# Patient Record
Sex: Male | Born: 1955 | Race: White | Hispanic: No | Marital: Married | Smoking: Former smoker
Health system: Southern US, Community
[De-identification: ages and names within clinical notes are randomized; demographics above are authoritative.]

## PROBLEM LIST (undated history)

## (undated) DIAGNOSIS — E785 Hyperlipidemia, unspecified: Secondary | ICD-10-CM

## (undated) DIAGNOSIS — Z87442 Personal history of urinary calculi: Secondary | ICD-10-CM

## (undated) DIAGNOSIS — M1990 Unspecified osteoarthritis, unspecified site: Secondary | ICD-10-CM

## (undated) DIAGNOSIS — K219 Gastro-esophageal reflux disease without esophagitis: Secondary | ICD-10-CM

## (undated) DIAGNOSIS — F988 Other specified behavioral and emotional disorders with onset usually occurring in childhood and adolescence: Secondary | ICD-10-CM

## (undated) DIAGNOSIS — J309 Allergic rhinitis, unspecified: Secondary | ICD-10-CM

## (undated) DIAGNOSIS — I499 Cardiac arrhythmia, unspecified: Secondary | ICD-10-CM

## (undated) DIAGNOSIS — N200 Calculus of kidney: Secondary | ICD-10-CM

## (undated) DIAGNOSIS — I10 Essential (primary) hypertension: Secondary | ICD-10-CM

## (undated) DIAGNOSIS — G5600 Carpal tunnel syndrome, unspecified upper limb: Secondary | ICD-10-CM

## (undated) DIAGNOSIS — K649 Unspecified hemorrhoids: Secondary | ICD-10-CM

## (undated) DIAGNOSIS — R7989 Other specified abnormal findings of blood chemistry: Secondary | ICD-10-CM

## (undated) DIAGNOSIS — N529 Male erectile dysfunction, unspecified: Secondary | ICD-10-CM

## (undated) DIAGNOSIS — Z973 Presence of spectacles and contact lenses: Secondary | ICD-10-CM

## (undated) DIAGNOSIS — N289 Disorder of kidney and ureter, unspecified: Secondary | ICD-10-CM

## (undated) DIAGNOSIS — K5792 Diverticulitis of intestine, part unspecified, without perforation or abscess without bleeding: Secondary | ICD-10-CM

## (undated) DIAGNOSIS — N201 Calculus of ureter: Secondary | ICD-10-CM

## (undated) DIAGNOSIS — A0472 Enterocolitis due to Clostridium difficile, not specified as recurrent: Secondary | ICD-10-CM

## (undated) DIAGNOSIS — Z972 Presence of dental prosthetic device (complete) (partial): Secondary | ICD-10-CM

## (undated) HISTORY — DX: Unspecified hemorrhoids: K64.9

## (undated) HISTORY — DX: Calculus of kidney: N20.0

## (undated) HISTORY — DX: Male erectile dysfunction, unspecified: N52.9

## (undated) HISTORY — DX: Allergic rhinitis, unspecified: J30.9

## (undated) HISTORY — PX: ORCHIOPEXY: SHX479

## (undated) HISTORY — DX: Carpal tunnel syndrome, unspecified upper limb: G56.00

## (undated) HISTORY — PX: COLONOSCOPY: SHX174

## (undated) HISTORY — DX: Other specified abnormal findings of blood chemistry: R79.89

## (undated) HISTORY — DX: Enterocolitis due to Clostridium difficile, not specified as recurrent: A04.72

## (undated) HISTORY — DX: Other specified behavioral and emotional disorders with onset usually occurring in childhood and adolescence: F98.8

## (undated) SURGERY — Surgical Case
Anesthesia: *Unknown

---

## 2006-02-16 ENCOUNTER — Ambulatory Visit (HOSPITAL_BASED_OUTPATIENT_CLINIC_OR_DEPARTMENT_OTHER): Admission: RE | Admit: 2006-02-16 | Discharge: 2006-02-16 | Payer: Self-pay | Admitting: Orthopedic Surgery

## 2009-05-09 HISTORY — PX: SHOULDER OPEN ROTATOR CUFF REPAIR: SHX2407

## 2014-05-04 ENCOUNTER — Encounter (HOSPITAL_COMMUNITY): Payer: Self-pay | Admitting: *Deleted

## 2014-05-04 ENCOUNTER — Emergency Department (HOSPITAL_COMMUNITY)
Admission: EM | Admit: 2014-05-04 | Discharge: 2014-05-04 | Disposition: A | Discharge status: Home / Self Care | Payer: BC Managed Care – PPO | Attending: Emergency Medicine | Admitting: Emergency Medicine

## 2014-05-04 ENCOUNTER — Emergency Department (HOSPITAL_COMMUNITY): Payer: BC Managed Care – PPO

## 2014-05-04 DIAGNOSIS — N201 Calculus of ureter: Secondary | ICD-10-CM | POA: Diagnosis not present

## 2014-05-04 DIAGNOSIS — R109 Unspecified abdominal pain: Secondary | ICD-10-CM | POA: Diagnosis present

## 2014-05-04 DIAGNOSIS — Z7982 Long term (current) use of aspirin: Secondary | ICD-10-CM | POA: Insufficient documentation

## 2014-05-04 DIAGNOSIS — Z79899 Other long term (current) drug therapy: Secondary | ICD-10-CM | POA: Insufficient documentation

## 2014-05-04 DIAGNOSIS — Z791 Long term (current) use of non-steroidal anti-inflammatories (NSAID): Secondary | ICD-10-CM | POA: Insufficient documentation

## 2014-05-04 LAB — CBC WITH DIFFERENTIAL/PLATELET
BASOS ABS: 0 10*3/uL (ref 0.0–0.1)
Basophils Relative: 0 % (ref 0–1)
EOS PCT: 0 % (ref 0–5)
Eosinophils Absolute: 0 10*3/uL (ref 0.0–0.7)
HEMATOCRIT: 42.9 % (ref 39.0–52.0)
Hemoglobin: 15 g/dL (ref 13.0–17.0)
LYMPHS ABS: 1.5 10*3/uL (ref 0.7–4.0)
Lymphocytes Relative: 16 % (ref 12–46)
MCH: 30.1 pg (ref 26.0–34.0)
MCHC: 35 g/dL (ref 30.0–36.0)
MCV: 86.1 fL (ref 78.0–100.0)
MONO ABS: 0.8 10*3/uL (ref 0.1–1.0)
Monocytes Relative: 9 % (ref 3–12)
Neutro Abs: 6.8 10*3/uL (ref 1.7–7.7)
Neutrophils Relative %: 75 % (ref 43–77)
Platelets: 257 10*3/uL (ref 150–400)
RBC: 4.98 MIL/uL (ref 4.22–5.81)
RDW: 12.8 % (ref 11.5–15.5)
WBC: 9.2 10*3/uL (ref 4.0–10.5)

## 2014-05-04 LAB — COMPREHENSIVE METABOLIC PANEL
ALT: 49 U/L (ref 0–53)
AST: 36 U/L (ref 0–37)
Albumin: 4.6 g/dL (ref 3.5–5.2)
Alkaline Phosphatase: 75 U/L (ref 39–117)
Anion gap: 8 (ref 5–15)
BILIRUBIN TOTAL: 0.8 mg/dL (ref 0.3–1.2)
BUN: 20 mg/dL (ref 6–23)
CALCIUM: 9 mg/dL (ref 8.4–10.5)
CHLORIDE: 107 meq/L (ref 96–112)
CO2: 23 mmol/L (ref 19–32)
Creatinine, Ser: 0.9 mg/dL (ref 0.50–1.35)
GFR calc non Af Amer: >90 mL/min (ref >90)
GLUCOSE: 106 mg/dL — AB (ref 70–99)
Potassium: 3.8 mmol/L (ref 3.5–5.1)
SODIUM: 138 mmol/L (ref 135–145)
Total Protein: 7.3 g/dL (ref 6.0–8.3)

## 2014-05-04 LAB — URINE MICROSCOPIC-ADD ON

## 2014-05-04 LAB — URINALYSIS, ROUTINE W REFLEX MICROSCOPIC
BILIRUBIN URINE: NEGATIVE
GLUCOSE, UA: NEGATIVE mg/dL
KETONES UR: NEGATIVE mg/dL
Nitrite: NEGATIVE
PROTEIN: 30 mg/dL — AB
Specific Gravity, Urine: 1.025 (ref 1.005–1.030)
UROBILINOGEN UA: 0.2 mg/dL (ref 0.0–1.0)
pH: 5.5 (ref 5.0–8.0)

## 2014-05-04 MED ORDER — HYDROMORPHONE HCL 1 MG/ML IJ SOLN
1.0000 mg | INTRAMUSCULAR | Status: DC | PRN
  Administered 2014-05-04: 1 mg via INTRAVENOUS
  Filled 2014-05-04: qty 1

## 2014-05-04 MED ORDER — OXYCODONE-ACETAMINOPHEN 5-325 MG PO TABS: 1.0000 | ORAL_TABLET | Freq: Four times a day (QID) | ORAL | Status: DC | PRN

## 2014-05-04 MED ORDER — ONDANSETRON HCL 4 MG/2ML IJ SOLN
4.0000 mg | Freq: Once | INTRAMUSCULAR | Status: AC
  Administered 2014-05-04: 4 mg via INTRAVENOUS
  Filled 2014-05-04: qty 2

## 2014-05-04 MED ORDER — HYDROMORPHONE HCL 1 MG/ML IJ SOLN
1.0000 mg | Freq: Once | INTRAMUSCULAR | Status: AC
  Administered 2014-05-04: 1 mg via INTRAVENOUS
  Filled 2014-05-04: qty 1

## 2014-05-04 MED ORDER — HYDROCODONE-ACETAMINOPHEN 5-325 MG PO TABS: 2.0000 | ORAL_TABLET | ORAL | Status: DC | PRN

## 2014-05-04 MED ORDER — TAMSULOSIN HCL 0.4 MG PO CAPS: 0.4000 mg | ORAL_CAPSULE | Freq: Every day | ORAL | Status: DC

## 2014-05-04 MED ORDER — KETOROLAC TROMETHAMINE 30 MG/ML IJ SOLN
30.0000 mg | Freq: Once | INTRAMUSCULAR | Status: AC
  Administered 2014-05-04: 30 mg via INTRAVENOUS
  Filled 2014-05-04: qty 1

## 2014-05-04 MED ORDER — ONDANSETRON 4 MG PO TBDP: 4.0000 mg | ORAL_TABLET | Freq: Three times a day (TID) | ORAL | Status: DC | PRN

## 2014-05-04 MED ORDER — NAPROXEN 500 MG PO TABS: 500.0000 mg | ORAL_TABLET | Freq: Two times a day (BID) | ORAL | Status: DC

## 2014-05-04 NOTE — Discharge Instructions (Signed)
Kidney Stones  Kidney stones (urolithiasis) are deposits that form inside your kidneys. The intense pain is caused by the stone moving through the urinary tract. When the stone moves, the ureter goes into spasm around the stone. The stone is usually passed in the urine.   CAUSES   · A disorder that makes certain neck glands produce too much parathyroid hormone (primary hyperparathyroidism).  · A buildup of uric acid crystals, similar to gout in your joints.  · Narrowing (stricture) of the ureter.  · A kidney obstruction present at birth (congenital obstruction).  · Previous surgery on the kidney or ureters.  · Numerous kidney infections.  SYMPTOMS   · Feeling sick to your stomach (nauseous).  · Throwing up (vomiting).  · Blood in the urine (hematuria).  · Pain that usually spreads (radiates) to the groin.  · Frequency or urgency of urination.  DIAGNOSIS   · Taking a history and physical exam.  · Blood or urine tests.  · CT scan.  · Occasionally, an examination of the inside of the urinary bladder (cystoscopy) is performed.  TREATMENT   · Observation.  · Increasing your fluid intake.  · Extracorporeal shock wave lithotripsy--This is a noninvasive procedure that uses shock waves to break up kidney stones.  · Surgery may be needed if you have severe pain or persistent obstruction. There are various surgical procedures. Most of the procedures are performed with the use of small instruments. Only small incisions are needed to accommodate these instruments, so recovery time is minimized.  The size, location, and chemical composition are all important variables that will determine the proper choice of action for you. Talk to your health care provider to better understand your situation so that you will minimize the risk of injury to yourself and your kidney.   HOME CARE INSTRUCTIONS   · Drink enough water and fluids to keep your urine clear or pale yellow. This will help you to pass the stone or stone fragments.  · Strain  all urine through the provided strainer. Keep all particulate matter and stones for your health care provider to see. The stone causing the pain may be as small as a grain of salt. It is very important to use the strainer each and every time you pass your urine. The collection of your stone will allow your health care provider to analyze it and verify that a stone has actually passed. The stone analysis will often identify what you can do to reduce the incidence of recurrences.  · Only take over-the-counter or prescription medicines for pain, discomfort, or fever as directed by your health care provider.  · Make a follow-up appointment with your health care provider as directed.  · Get follow-up X-rays if required. The absence of pain does not always mean that the stone has passed. It may have only stopped moving. If the urine remains completely obstructed, it can cause loss of kidney function or even complete destruction of the kidney. It is your responsibility to make sure X-rays and follow-ups are completed. Ultrasounds of the kidney can show blockages and the status of the kidney. Ultrasounds are not associated with any radiation and can be performed easily in a matter of minutes.  SEEK MEDICAL CARE IF:  · You experience pain that is progressive and unresponsive to any pain medicine you have been prescribed.  SEEK IMMEDIATE MEDICAL CARE IF:   · Pain cannot be controlled with the prescribed medicine.  · You have a fever or   shaking chills.  · The severity or intensity of pain increases over 18 hours and is not relieved by pain medicine.  · You develop a new onset of abdominal pain.  · You feel faint or pass out.  · You are unable to urinate.  MAKE SURE YOU:   · Understand these instructions.  · Will watch your condition.  · Will get help right away if you are not doing well or get worse.  Document Released: 04/25/2005 Document Revised: 12/26/2012 Document Reviewed: 09/26/2012  Exit[employer]® Patient Information ©2015  [employer]. This information is not intended to replace advice given to you by your health care provider. Make sure you discuss any questions you have with your health care provider.    Dietary Guidelines to Help Prevent Kidney Stones  Your risk of kidney stones can be decreased by adjusting the foods you eat. The most important thing you can do is drink enough fluid. You should drink enough fluid to keep your urine clear or pale yellow. The following guidelines provide specific information for the type of kidney stone you have had.  GUIDELINES ACCORDING TO TYPE OF KIDNEY STONE  Calcium Oxalate Kidney Stones  · Reduce the amount of salt you eat. Foods that have a lot of salt cause your body to release excess calcium into your urine. The excess calcium can combine with a substance called oxalate to form kidney stones.  · Reduce the amount of animal protein you eat if the amount you eat is excessive. Animal protein causes your body to release excess calcium into your urine. Ask your dietitian how much protein from animal sources you should be eating.  · Avoid foods that are high in oxalates. If you take vitamins, they should have less than 500 mg of vitamin C. Your body turns vitamin C into oxalates. You do not need to avoid fruits and vegetables high in vitamin C.  Calcium Phosphate Kidney Stones  · Reduce the amount of salt you eat to help prevent the release of excess calcium into your urine.  · Reduce the amount of animal protein you eat if the amount you eat is excessive. Animal protein causes your body to release excess calcium into your urine. Ask your dietitian how much protein from animal sources you should be eating.  · Get enough calcium from food or take a calcium supplement (ask your dietitian for recommendations). Food sources of calcium that do not increase your risk of kidney stones include:  ¨ Broccoli.  ¨ Dairy products, such as cheese and yogurt.  ¨ Pudding.  Uric Acid Kidney Stones  · Do not  have more than 6 oz of animal protein per day.  FOOD SOURCES  Animal Protein Sources  · Meat (all types).  · Poultry.  · Eggs.  · Fish, seafood.  Foods High in Salt  · Salt seasonings.  · Soy sauce.  · Teriyaki sauce.  · Cured and processed meats.  · Salted crackers and snack foods.  · Fast food.  · Canned soups and most canned foods.  Foods High in Oxalates  · Grains:  ¨ Amaranth.  ¨ Barley.  ¨ Grits.  ¨ Wheat germ.  ¨ Bran.  ¨ Buckwheat flour.  ¨ All bran cereals.  ¨ Pretzels.  ¨ Whole wheat bread.  · Vegetables:  ¨ Beans (wax).  ¨ Beets and beet greens.  ¨ Collard greens.  ¨ Eggplant.  ¨ Escarole.  ¨ Leeks.  ¨ Okra.  ¨ Parsley.  ¨ Rutabagas.  ¨   Spinach.  ¨ Swiss chard.  ¨ Tomato paste.  ¨ Fried potatoes.  ¨ Sweet potatoes.  · Fruits:  ¨ Red currants.  ¨ Figs.  ¨ Kiwi.  ¨ Rhubarb.  · Meat and Other Protein Sources:  ¨ Beans (dried).  ¨ Soy burgers and other soybean products.  ¨ Miso.  ¨ Nuts (peanuts, almonds, pecans, cashews, hazelnuts).  ¨ Nut butters.  ¨ Sesame seeds and tahini (paste made of sesame seeds).  ¨ Poppy seeds.  · Beverages:  ¨ Chocolate drink mixes.  ¨ Soy milk.  ¨ Instant iced tea.  ¨ Juices made from high-oxalate fruits or vegetables.  · Other:  ¨ Carob.  ¨ Chocolate.  ¨ Fruitcake.  ¨ Marmalades.  Document Released: 08/20/2010 Document Revised: 04/30/2013 Document Reviewed: 03/22/2013  ExitCare® Patient Information ©2015 [employer]. This information is not intended to replace advice given to you by your health care provider. Make sure you discuss any questions you have with your health care provider.

## 2014-05-04 NOTE — ED Notes (Signed)
PA at bedside.

## 2014-05-04 NOTE — ED Provider Notes (Signed)
CSN: [id number]     Arrival date & time 05/04/14  1217 History   First MD Initiated Contact with Patient 05/04/14 1346     Chief Complaint  Patient presents with  . Flank Pain   Christopher Gordon is a 58 y.o. male with a history of kidney stones or presents the emergency department complaining of left flank pain since 11 AM this morning. Patient reports he started having bilateral testicular pain then resolved and traveled to his left flank. Patient rates his left flank pain at 8 out of 10 and reports 8 fluctuates in intensity. The patient reports he has had multiple kidney stones before and is once previously had a kidney stone start with pain in his testicles. Patient reports his testicular pain is since resolved. Patient reports he's been taking potassium citrate and this has prevented kidney stones last 5 years until 3 months ago when he had his most recent kidney stone. Patient denies ever requiring lithotripsy or stents. The patient does not have a current urologist. Patient cannot remember the last time he had a CT scan. Patient reports nausea without vomiting. The patient denies fevers, chills, vomiting, diarrhea, hematuria, dysuria, urinary urgency, hematochezia, testicular swelling, penile discharge, genital lesions, chest pain, shortness of breath, wheezing, numbness, tingling or weakness.   (Consider location/radiation/quality/duration/timing/severity/associated sxs/prior Treatment) HPI  Past Medical History  Diagnosis Date  . Kidney stones    History reviewed. No pertinent past surgical history. No family history on file. History  Substance Use Topics  . Smoking status: Never Smoker   . Smokeless tobacco: Not on file  . Alcohol Use: No    Review of Systems  Constitutional: Negative for fever and chills.  HENT: Negative for congestion, ear pain and sore throat.   Eyes: Negative for pain and visual disturbance.  Respiratory: Negative for cough, shortness of breath and wheezing.    Cardiovascular: Negative for chest pain, palpitations and leg swelling.  Gastrointestinal: Positive for nausea and abdominal pain. Negative for vomiting, diarrhea and blood in stool.  Genitourinary: Positive for flank pain and testicular pain. Negative for dysuria, urgency, frequency, hematuria, discharge, penile swelling, scrotal swelling, genital sores and penile pain.  Musculoskeletal: Negative for back pain, neck pain and neck stiffness.  Skin: Negative for rash and wound.  Neurological: Negative for weakness, light-headedness, numbness and headaches.  All other systems reviewed and are negative.     Allergies  Demerol  Home Medications   Prior to Admission medications   Medication Sig Start Date End Date Taking? Authorizing Provider  aspirin EC 81 MG tablet Take 81 mg by mouth daily.   Yes Historical Provider, MD  Coenzyme Q10 (CO Q 10 PO) Take 1 tablet by mouth daily.   Yes Historical Provider, MD  HYDROcodone-acetaminophen (NORCO/VICODIN) 5-325 MG per tablet Take 2 tablets by mouth every 4 (four) hours as needed for moderate pain or severe pain. 05/04/14   Einar Gip Tomasz Steeves, PA-C  loratadine (CLARITIN) 10 MG tablet Take 10 mg by mouth 2 (two) times daily.   Yes Historical Provider, MD  methylphenidate (METADATE CD) 20 MG CR capsule Take 20 mg by mouth 2 (two) times daily.   Yes Historical Provider, MD  Multiple Vitamin (MULTIVITAMIN WITH MINERALS) TABS tablet Take 1 tablet by mouth daily.   Yes Historical Provider, MD  naproxen (NAPROSYN) 500 MG tablet Take 1 tablet (500 mg total) by mouth 2 (two) times daily with a meal. 05/04/14   Lawana Chambers, PA-C  Omega-3 Fatty Acids (FISH  OIL PO) Take 1-2 capsules by mouth. Takes two in the am and one capsule at night.   Yes Historical Provider, MD  omeprazole (PRILOSEC) 20 MG capsule Take 20 mg by mouth daily.   Yes Historical Provider, MD  ondansetron (ZOFRAN ODT) 4 MG disintegrating tablet Take 1 tablet (4 mg total) by mouth  every 8 (eight) hours as needed for nausea or vomiting. 05/04/14   Einar GipWilliam Duncan Ercilia Bettinger, PA-C  potassium citrate (UROCIT-K) 10 MEQ (1080 MG) SR tablet Take 10 mEq by mouth 2 (two) times daily.   Yes Historical Provider, MD  simvastatin (ZOCOR) 40 MG tablet Take 40 mg by mouth daily.   Yes Historical Provider, MD  tamsulosin (FLOMAX) 0.4 MG CAPS capsule Take 1 capsule (0.4 mg total) by mouth daily. 05/04/14   Einar GipWilliam Duncan Dayne Chait, PA-C   BP 118/79 mmHg  Pulse 90  Temp(Src) 98.1 F (36.7 C) (Oral)  Resp 18  Ht 5\' 6"  (1.676 m)  Wt 177 lb (80.287 kg)  BMI 28.58 kg/m2  SpO2 96% Physical Exam  Constitutional: He appears well-developed and well-nourished. No distress.  HENT:  Head: Normocephalic and atraumatic.  Right Ear: External ear normal.  Left Ear: External ear normal.  Mouth/Throat: Oropharynx is clear and moist. No oropharyngeal exudate.  Eyes: Conjunctivae are normal. Pupils are equal, round, and reactive to light. Right eye exhibits no discharge. Left eye exhibits no discharge.  Neck: Neck supple.  Cardiovascular: Normal rate, regular rhythm, normal heart sounds and intact distal pulses.  Exam reveals no gallop and no friction rub.   No murmur heard. Pulmonary/Chest: Effort normal and breath sounds normal. No respiratory distress. He has no wheezes. He has no rales.  Abdominal: Soft. Bowel sounds are normal. He exhibits no distension and no mass. There is tenderness. There is no rebound and no guarding.  Patient's abdomen is soft. Bowel sounds are present. Patient has left flank tenderness and midepigastric tenderness to palpation. No right lower quadrant tenderness to palpation.  Negative psoas and obturator sign. Negative Rovsing sign. Negative Murphy sign.  Musculoskeletal: He exhibits no edema.  No lower extremity edema  Lymphadenopathy:    He has no cervical adenopathy.  Neurological: He is alert. Coordination normal.  Skin: Skin is warm and dry. No rash noted. He is not  diaphoretic. No erythema. No pallor.  Psychiatric: He has a normal mood and affect. His behavior is normal.  Nursing note and vitals reviewed.   ED Course  Procedures (including critical care time) Labs Review Labs Reviewed  URINALYSIS, ROUTINE W REFLEX MICROSCOPIC - Abnormal; Notable for the following:    Color, Urine AMBER (*)    APPearance HAZY (*)    Hgb urine dipstick LARGE (*)    Protein, ur 30 (*)    Leukocytes, UA TRACE (*)    All other components within normal limits  URINE MICROSCOPIC-ADD ON - Abnormal; Notable for the following:    Bacteria, UA FEW (*)    All other components within normal limits  COMPREHENSIVE METABOLIC PANEL - Abnormal; Notable for the following:    Glucose, Bld 106 (*)    All other components within normal limits  CBC WITH DIFFERENTIAL    Imaging Review Ct Renal Stone Study  05/04/2014   CLINICAL DATA:  Acute left flank pain and dysuria since earlier today.  EXAM: CT ABDOMEN AND PELVIS WITHOUT CONTRAST  TECHNIQUE: Multidetector CT imaging of the abdomen and pelvis was performed following the standard protocol without IV contrast.  COMPARISON:  None.  FINDINGS: Lower chest: Minor streaky bibasilar atelectasis or scarring. No lower lobe pneumonia, collapse or consolidation. No edema pattern or pneumothorax. Normal heart size. No pericardial or pleural effusion. Negative for hiatal hernia.  Abdomen: Left kidney demonstrates perinephric stranding edema with mild hydronephrosis and hydroureter. Periureteral stranding edema also evident. This is secondary to a left mid to distal ureteral obstructing calculus measuring 5.3 mm in diameter, image 54. Below this calculus, the left distal ureter and UVJ are unremarkable. Left kidney also demonstrates an upper pole exophytic 22 mm lesion which is isodense to the renal cortex. This remains indeterminate. Recommend initial follow up with nonemergent renal ultrasound.  Right kidney and ureter demonstrate no acute process.  Punctate tiny nonobstructing intrarenal calculi present in the right kidney.  Liver, gallbladder, biliary system, pancreas, spleen, and adrenal glands are within normal limits for age and noncontrast imaging.  Negative for bowel obstruction, dilatation, ileus, or free air.  Normal appendix demonstrated.  Aortic atherosclerosis noted without aneurysm.  Scattered colonic diverticulosis.  Pelvis: Urinary bladder unremarkable. Distal colon is collapsed. No pelvic free fluid, fluid collection, hemorrhage, abscess, adenopathy, inguinal abnormality, or hernia. Vascular calcifications present pelvis. Small prostate calcifications noted.  Mild diffuse degenerative changes of the spine. No acute osseous finding.  IMPRESSION: 5 mm acutely obstructing left mid to distal ureteral calculus with proximal left hydroureteronephrosis and perinephric strandy edema/inflammation.  22 mm exophytic left upper pole renal lesion by noncontrast imaging. Recommend initial evaluation with a nonemergent renal ultrasound for further evaluation.  Punctate nonobstructing right intrarenal calculi  Diffuse colonic diverticulosis without acute inflammation  Aortic atherosclerosis without aneurysm  Normal appendix   Electronically Signed   By: Ruel Favorsrevor  Shick M.D.   On: 05/04/2014 16:15     EKG Interpretation None      Filed Vitals:   05/04/14 1239 05/04/14 1414 05/04/14 1705  BP: 165/96 158/92 118/79  Pulse: 62 90 90  Temp: 98.1 F (36.7 C)    TempSrc: Oral    Resp: 18 18 18   Height: 5\' 6"  (1.676 m)    Weight: 177 lb (80.287 kg)    SpO2: 97% 98% 96%     MDM   Final diagnoses:  Ureterolithiasis   Patient presented to the emergency department complaining of left flank pain since 11 AM today. The patient has long history of kidney stones. Has not had a recent CT scan due to not having kidney stones recently. Patient's urinalysis indicated a large hemoglobin. The patient's urinalysis is negative for infection. The patient's CBC  and CMP are unremarkable. The patient's CT scan indicated a 5 mm ureteral stone on the left. The patient is afebrile and nontoxic appearing. At reevaluation the patient reports his pain is much improved and he is tolerating by mouth liquids. We'll discharge this patient with Flomax, naprosyn, Norco and Zofran. Advised patient to use caution when taking Norvasc make drowsy. As patient not to drive while taking Norco. Patient is given information for follow-up with [facility]. Advised patient to also follow-up with his primary care provider. Advised patient to return to the emergency department with new or worsening symptoms or new concerns. The patient verbalizes understanding and agreement with plan.  This patient was discussed with and evaluated by Dr. Patria Maneampos who agrees with assessment and plan.    Lawana ChambersWilliam Duncan Reneshia Zuccaro, PA-C 05/04/14 1803  Lyanne CoKevin M Campos, MD 05/05/14 2133

## 2014-05-04 NOTE — ED Notes (Signed)
Pt complains of pain in his left flank since 11AM today. Pt states he initially felt pain in his testicles, which then moved to his left flank. Pt has hx of kidney stones, states he last had one 3 months ago. Pt takes potasium citrate to prevent kidney stones, which he states has been helping until recently. Pt states he has nausea, denies vomiting.

## 2014-07-30 ENCOUNTER — Encounter (HOSPITAL_BASED_OUTPATIENT_CLINIC_OR_DEPARTMENT_OTHER): Payer: Self-pay | Admitting: *Deleted

## 2014-07-30 ENCOUNTER — Other Ambulatory Visit: Payer: Self-pay | Admitting: Urology

## 2014-07-30 NOTE — Progress Notes (Signed)
NPO AFTER MN. ARRIVE AT 0600. NEEDS HG. WILL TAKE PRILOSEC AM DOS W/ SIPS OF WATER AND IF NEEDED PAIN/ NAUSEA RX.

## 2014-07-30 NOTE — H&P (Signed)
Active Problems Problems  1. Calculus of left ureter (N20.1) 2. Decreased libido (R68.82) 3. Hydronephrosis, left (N13.30) 4. Increased urinary frequency (R35.0) 5. Neoplasm Of The Prostate Gland 6. 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 Urinary urgency (R39.15)  History of Present Illness Christopher Gordon returns today for a left mid ureteral stone. He has had some mild persistent symptoms including occasional frequency. He has not seen the stone pass but has had no gross hematuria.   Past Medical History Problems  1. History of arthritis (Z87.39) 2. History of esophageal reflux (Z87.19) 3. History of hypercholesterolemia (Z86.39) 4. History of kidney stones (Z87.442) 5. 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 History of Shoulder Surgery 2. History of Surgery Testis Exploration Of Undescended Testis Right  Current Meds 1. Aspirin Low Dose 81 MG Oral Tablet;  Therapy: (Recorded:23Mar2009) to Recorded 2. Claritin TABS;  Therapy: (Recorded:03Feb2012) to Recorded 3. Co Q 10 CAPS;  Therapy: (Recorded:01Feb2016) to Recorded 4. Fish Oil CAPS;  Therapy: (Recorded:12Jan2011) to Recorded 5. Fluticasone Propionate 50 MCG/ACT Nasal Suspension;  Therapy: 13KGM0102 to Recorded 6. Hydrocodone-Acetaminophen 10-325 MG Oral Tablet; TAKE 1 TABLET Every 4 hours  PRN;  Therapy: [id number] to (Last Rx:01Feb2016) Ordered 7. Meloxicam 15 MG Oral Tablet;  Therapy: (Recorded:01Feb2016) to Recorded 8. Omeprazole 20 MG Oral Tablet Delayed Release;  Therapy: (Recorded:01Feb2016) to Recorded 9. Potassium Citrate ER 10 MEQ (1080 MG) Oral Tablet Extended Release; Take 1 tablet  twice daily;  Therapy: 29Apr2009 to (Evaluate:28Jan2013); Last Rx:03Feb2012 Ordered 10. Promethazine HCl - 25 MG Oral Tablet; TAKE 1 TABLET Every  6 hours PRN;   Therapy: 34VQQ5956 to (Last Rx:01Feb2016)  Requested for: 38VFI4332 Ordered 11. Ritalin 20 MG Oral Tablet;  Therapy: (Recorded:01Feb2016) to Recorded 12. Simvastatin 20 MG Oral Tablet;   Therapy: 08Oct2010 to Recorded 13. Tamsulosin HCl - 0.4 MG Oral Capsule; TAKE 1 CAPSULE Daily;   Therapy: 95JOA4166 to (Evaluate:02Mar2016)  Requested for: 06TKZ6010; Last   Rx:01Feb2016 Ordered 14. Vitamin B-12 TABS;   Therapy: (Recorded:12Jan2011) to Recorded 15. Vitamin D CAPS;   Therapy: (Recorded:12Jan2011) to Recorded  Allergies Medication  1. Crestor TABS 2. Demerol TABS  Family History Problems  1. Family history of Arthritis : Mother 2. Family history of Arthritis : Father 3. Family history of Diabetes Mellitus : Mother 4. Family history of Diabetes Mellitus : Brother 5. Family history of Family Health Status - Father's Age : Mother   48 6. Family history of Family Health Status - Mother's Age : Mother   72 7. Family history of Family Health Status Number Of Children   1 son   1 daughter 39. Family history of Alzheimer's disease (Z82.0) : Father 40. Family history of kidney stones (Z84.1) : Father 10. Family history of Heart Disease : Mother 61. Family history of Hematuria : Mother 21. Family history of Hypercholesterolemia : Mother 81. Family history of Nephrolithiasis : Mother 30. Family history of Nephrolithiasis : Brother  Social History Problems  1. Alcohol Use   occasional wine 2. Caffeine Use   2-3 per day 3. Former smoker [phone]) 4. Marital History - Currently Married 5. Occupation:   Orthoptist - [employer] 6. History of Tobacco Use   smoked 1 ppd for 10 yrs  Review of Systems  Gastrointestinal: nausea   The patient presents with complaints of vomiting (with a GI bug last week. ).    Vitals Vital Signs [Data Includes: Last 1 Day]  Recorded: 73UKG2542 01:37PM  Blood Pressure: 131 /  70 Temperature: 97.2 F Heart Rate: 56  Results/Data Urine [Data Includes: Last 1 Day]   [id number]  COLOR YELLOW   APPEARANCE CLEAR   SPECIFIC GRAVITY 1.010    pH 6.5   GLUCOSE NEG mg/dL  BILIRUBIN NEG   KETONE NEG mg/dL  BLOOD SMALL   PROTEIN NEG mg/dL  UROBILINOGEN 0.2 mg/dL  NITRITE NEG   LEUKOCYTE ESTERASE NEG   SQUAMOUS EPITHELIAL/HPF NONE SEEN   WBC 0-2 WBC/hpf  RBC 11-20 RBC/hpf  BACTERIA RARE   CRYSTALS NONE SEEN   CASTS NONE SEEN    The following images/tracing/specimen were independently visualized:  KUB today shows progression of the left mid ureteral stone into the upper portion of the distal ureter. The stone measures about 4 x 19m. There are stable pelvic phleboliths. He has punctate RUP and RLP stones. There are no significant bone, gas or soft tissue abnormalities.  The following clinical lab reports were reviewed:  UA reviewed.    Assessment Assessed  1. Calculus of left ureter (N20.1)  He has had some progression of the stone into the upper distal ureter and is minimally symptomatic.   Plan Calculus of left ureter  1. Renew: Hydrocodone-Acetaminophen 10-325 MG Oral Tablet; TAKE 1 TABLET Every 4  hours PRN 2. Renew: Tamsulosin HCl - 0.4 MG Oral Capsule; TAKE 1 CAPSULE Daily 3. KUB; Status:Hold For - Appointment,Date of Service; Requested for:03Mar2016;  4. RENAL U/S LEFT; Status:Hold For - Appointment,Date of Service; Requested  for:03Mar2016;  5. Follow-up Weeks 3-4 Office  Follow-up  Status: Hold For - Appointment,Date of Service   Requested for: [id number] I discussed the options including continued MET, ESWL and ureteroscopy.  After reviewing the options, he will return in 3-4 weeks with a repeat KUB and left renal [place]   Meds refilled.

## 2014-07-31 ENCOUNTER — Ambulatory Visit (HOSPITAL_BASED_OUTPATIENT_CLINIC_OR_DEPARTMENT_OTHER): Payer: BLUE CROSS/BLUE SHIELD | Admitting: Anesthesiology

## 2014-07-31 ENCOUNTER — Encounter (HOSPITAL_BASED_OUTPATIENT_CLINIC_OR_DEPARTMENT_OTHER): Payer: Self-pay | Admitting: *Deleted

## 2014-07-31 ENCOUNTER — Encounter (HOSPITAL_BASED_OUTPATIENT_CLINIC_OR_DEPARTMENT_OTHER)
Admission: RE | Disposition: A | Discharge status: Home / Self Care | Payer: Self-pay | Source: Ambulatory Visit | Attending: Urology

## 2014-07-31 ENCOUNTER — Ambulatory Visit (HOSPITAL_BASED_OUTPATIENT_CLINIC_OR_DEPARTMENT_OTHER)
Admission: RE | Admit: 2014-07-31 | Discharge: 2014-07-31 | Disposition: A | Discharge status: Home / Self Care | Payer: BLUE CROSS/BLUE SHIELD | Source: Ambulatory Visit | Attending: Urology | Admitting: Urology

## 2014-07-31 DIAGNOSIS — N201 Calculus of ureter: Secondary | ICD-10-CM | POA: Diagnosis not present

## 2014-07-31 DIAGNOSIS — K219 Gastro-esophageal reflux disease without esophagitis: Secondary | ICD-10-CM | POA: Diagnosis not present

## 2014-07-31 DIAGNOSIS — Z7982 Long term (current) use of aspirin: Secondary | ICD-10-CM | POA: Diagnosis not present

## 2014-07-31 DIAGNOSIS — M1990 Unspecified osteoarthritis, unspecified site: Secondary | ICD-10-CM | POA: Diagnosis not present

## 2014-07-31 DIAGNOSIS — E785 Hyperlipidemia, unspecified: Secondary | ICD-10-CM | POA: Insufficient documentation

## 2014-07-31 DIAGNOSIS — Z87891 Personal history of nicotine dependence: Secondary | ICD-10-CM | POA: Diagnosis not present

## 2014-07-31 HISTORY — DX: Gastro-esophageal reflux disease without esophagitis: K21.9

## 2014-07-31 HISTORY — PX: CYSTOSCOPY WITH STENT PLACEMENT: SHX5790

## 2014-07-31 HISTORY — DX: Calculus of ureter: N20.1

## 2014-07-31 HISTORY — DX: Presence of dental prosthetic device (complete) (partial): Z97.2

## 2014-07-31 HISTORY — DX: Personal history of urinary calculi: Z87.442

## 2014-07-31 HISTORY — DX: Presence of spectacles and contact lenses: Z97.3

## 2014-07-31 HISTORY — DX: Calculus of kidney: N20.0

## 2014-07-31 HISTORY — DX: Hyperlipidemia, unspecified: E78.5

## 2014-07-31 HISTORY — DX: Unspecified osteoarthritis, unspecified site: M19.90

## 2014-07-31 LAB — POCT HEMOGLOBIN-HEMACUE: Hemoglobin: 14.3 g/dL (ref 13.0–17.0)

## 2014-07-31 SURGERY — URETEROSCOPY, WITH LITHOTRIPSY USING HOLMIUM LASER
Anesthesia: General | Site: Ureter | Laterality: Left

## 2014-07-31 MED ORDER — LACTATED RINGERS IV SOLN
INTRAVENOUS | Status: DC | PRN
  Administered 2014-07-31 (×2): via INTRAVENOUS

## 2014-07-31 MED ORDER — STERILE WATER FOR IRRIGATION IR SOLN
Status: DC | PRN
  Administered 2014-07-31: 500 mL

## 2014-07-31 MED ORDER — DEXAMETHASONE SODIUM PHOSPHATE 10 MG/ML IJ SOLN
INTRAMUSCULAR | Status: DC | PRN
  Administered 2014-07-31: 10 mg via INTRAVENOUS

## 2014-07-31 MED ORDER — CIPROFLOXACIN IN D5W 400 MG/200ML IV SOLN
INTRAVENOUS | Status: AC
  Filled 2014-07-31: qty 200

## 2014-07-31 MED ORDER — SODIUM CHLORIDE 0.9 % IV SOLN
250.0000 mL | INTRAVENOUS | Status: DC | PRN
  Filled 2014-07-31: qty 250

## 2014-07-31 MED ORDER — KETOROLAC TROMETHAMINE 30 MG/ML IJ SOLN
INTRAMUSCULAR | Status: DC | PRN
  Administered 2014-07-31: 30 mg via INTRAVENOUS

## 2014-07-31 MED ORDER — ACETAMINOPHEN 650 MG RE SUPP
650.0000 mg | RECTAL | Status: DC | PRN
Start: 2014-07-31 — End: 2014-07-31
  Filled 2014-07-31: qty 1

## 2014-07-31 MED ORDER — SODIUM CHLORIDE 0.9 % IJ SOLN
3.0000 mL | Freq: Two times a day (BID) | INTRAMUSCULAR | Status: DC
  Filled 2014-07-31: qty 3

## 2014-07-31 MED ORDER — PHENAZOPYRIDINE HCL 200 MG PO TABS
200.0000 mg | ORAL_TABLET | Freq: Once | ORAL | Status: AC
  Administered 2014-07-31: 200 mg via ORAL
  Filled 2014-07-31: qty 1

## 2014-07-31 MED ORDER — HYDROCODONE-ACETAMINOPHEN 10-325 MG PO TABS: 1.0000 | ORAL_TABLET | Freq: Four times a day (QID) | ORAL | Status: DC | PRN

## 2014-07-31 MED ORDER — LIDOCAINE HCL (CARDIAC) 20 MG/ML IV SOLN
INTRAVENOUS | Status: DC | PRN
  Administered 2014-07-31: 100 mg via INTRAVENOUS

## 2014-07-31 MED ORDER — ONDANSETRON HCL 4 MG/2ML IJ SOLN
INTRAMUSCULAR | Status: DC | PRN
  Administered 2014-07-31: 4 mg via INTRAVENOUS

## 2014-07-31 MED ORDER — SODIUM CHLORIDE 0.9 % IR SOLN
Status: DC | PRN
  Administered 2014-07-31: 3000 mL
  Administered 2014-07-31: 1000 mL

## 2014-07-31 MED ORDER — SODIUM CHLORIDE 0.9 % IJ SOLN
3.0000 mL | INTRAMUSCULAR | Status: DC | PRN
  Filled 2014-07-31: qty 3

## 2014-07-31 MED ORDER — FENTANYL CITRATE 0.05 MG/ML IJ SOLN
INTRAMUSCULAR | Status: AC
  Filled 2014-07-31: qty 4

## 2014-07-31 MED ORDER — CIPROFLOXACIN IN D5W 400 MG/200ML IV SOLN
400.0000 mg | INTRAVENOUS | Status: AC
  Administered 2014-07-31: 400 mg via INTRAVENOUS
  Filled 2014-07-31: qty 200

## 2014-07-31 MED ORDER — OXYCODONE HCL 5 MG PO TABS
5.0000 mg | ORAL_TABLET | Freq: Once | ORAL | Status: AC | PRN
  Administered 2014-07-31: 5 mg via ORAL
  Filled 2014-07-31: qty 1

## 2014-07-31 MED ORDER — OXYBUTYNIN CHLORIDE 5 MG PO TABS
ORAL_TABLET | ORAL | Status: AC
  Filled 2014-07-31: qty 1

## 2014-07-31 MED ORDER — LACTATED RINGERS IV SOLN
INTRAVENOUS | Status: DC
  Administered 2014-07-31: 07:00:00 via INTRAVENOUS
  Filled 2014-07-31: qty 1000

## 2014-07-31 MED ORDER — MIDAZOLAM HCL 2 MG/2ML IJ SOLN
INTRAMUSCULAR | Status: AC
  Filled 2014-07-31: qty 2

## 2014-07-31 MED ORDER — FENTANYL CITRATE 0.05 MG/ML IJ SOLN
INTRAMUSCULAR | Status: DC | PRN
  Administered 2014-07-31 (×2): 25 ug via INTRAVENOUS
  Administered 2014-07-31: 50 ug via INTRAVENOUS
  Administered 2014-07-31 (×2): 25 ug via INTRAVENOUS

## 2014-07-31 MED ORDER — OXYCODONE HCL 5 MG PO TABS
ORAL_TABLET | ORAL | Status: AC
  Filled 2014-07-31: qty 1

## 2014-07-31 MED ORDER — OXYCODONE HCL 5 MG/5ML PO SOLN
5.0000 mg | Freq: Once | ORAL | Status: AC | PRN
  Filled 2014-07-31: qty 5

## 2014-07-31 MED ORDER — PROMETHAZINE HCL 25 MG/ML IJ SOLN
6.2500 mg | INTRAMUSCULAR | Status: DC | PRN
Start: 2014-07-31 — End: 2014-07-31
  Filled 2014-07-31: qty 1

## 2014-07-31 MED ORDER — FENTANYL CITRATE 0.05 MG/ML IJ SOLN
25.0000 ug | INTRAMUSCULAR | Status: DC | PRN
  Filled 2014-07-31: qty 1

## 2014-07-31 MED ORDER — OXYCODONE HCL 5 MG PO TABS
5.0000 mg | ORAL_TABLET | ORAL | Status: DC | PRN
  Filled 2014-07-31: qty 2

## 2014-07-31 MED ORDER — PROPOFOL 10 MG/ML IV BOLUS
INTRAVENOUS | Status: DC | PRN
  Administered 2014-07-31: 100 mg via INTRAVENOUS
  Administered 2014-07-31: 200 mg via INTRAVENOUS

## 2014-07-31 MED ORDER — PHENAZOPYRIDINE HCL 100 MG PO TABS
ORAL_TABLET | ORAL | Status: AC
  Filled 2014-07-31: qty 2

## 2014-07-31 MED ORDER — MIDAZOLAM HCL 5 MG/5ML IJ SOLN
INTRAMUSCULAR | Status: DC | PRN
  Administered 2014-07-31 (×2): 1 mg via INTRAVENOUS

## 2014-07-31 MED ORDER — PHENAZOPYRIDINE HCL 200 MG PO TABS: 200.0000 mg | ORAL_TABLET | Freq: Three times a day (TID) | ORAL | Status: DC | PRN

## 2014-07-31 MED ORDER — ACETAMINOPHEN 10 MG/ML IV SOLN
INTRAVENOUS | Status: DC | PRN
  Administered 2014-07-31: 1000 mg via INTRAVENOUS

## 2014-07-31 MED ORDER — ACETAMINOPHEN 325 MG PO TABS
650.0000 mg | ORAL_TABLET | ORAL | Status: DC | PRN
  Filled 2014-07-31: qty 2

## 2014-07-31 MED ORDER — IOHEXOL 350 MG/ML SOLN
INTRAVENOUS | Status: DC | PRN
  Administered 2014-07-31: 10 mL

## 2014-07-31 MED ORDER — HYDROMORPHONE HCL 1 MG/ML IJ SOLN
0.2500 mg | INTRAMUSCULAR | Status: DC | PRN
  Filled 2014-07-31: qty 1

## 2014-07-31 SURGICAL SUPPLY — 41 items
BAG DRAIN URO-CYSTO SKYTR STRL (DRAIN) ×3 IMPLANT
BAG DRN UROCATH (DRAIN) ×1
BASKET LASER NITINOL 1.9FR (BASKET) IMPLANT
BASKET STONE 1.7 NGAGE (UROLOGICAL SUPPLIES) ×2 IMPLANT
BASKET ZERO TIP NITINOL 2.4FR (BASKET) IMPLANT
BSKT STON RTRVL 120 1.9FR (BASKET)
BSKT STON RTRVL ZERO TP 2.4FR (BASKET)
CANISTER SUCT LVC 12 LTR MEDI- (MISCELLANEOUS) ×2 IMPLANT
CATH URET 5FR 28IN CONE TIP (BALLOONS)
CATH URET 5FR 28IN OPEN ENDED (CATHETERS) ×3 IMPLANT
CATH URET 5FR 70CM CONE TIP (BALLOONS) IMPLANT
CLOTH BEACON ORANGE TIMEOUT ST (SAFETY) ×3 IMPLANT
ELECT REM PT RETURN 9FT ADLT (ELECTROSURGICAL)
ELECTRODE REM PT RTRN 9FT ADLT (ELECTROSURGICAL) IMPLANT
FIBER LASER FLEXIVA 1000 (UROLOGICAL SUPPLIES) IMPLANT
FIBER LASER FLEXIVA 200 (UROLOGICAL SUPPLIES) IMPLANT
FIBER LASER FLEXIVA 365 (UROLOGICAL SUPPLIES) IMPLANT
FIBER LASER FLEXIVA 550 (UROLOGICAL SUPPLIES) IMPLANT
FIBER LASER TRAC TIP (UROLOGICAL SUPPLIES) ×2 IMPLANT
GLOVE BIO SURGEON STRL SZ 6.5 (GLOVE) ×1 IMPLANT
GLOVE BIO SURGEONS STRL SZ 6.5 (GLOVE) ×1
GLOVE INDICATOR 6.5 STRL GRN (GLOVE) ×2 IMPLANT
GLOVE SURG SS PI 8.0 STRL IVOR (GLOVE) ×3 IMPLANT
GOWN PREVENTION PLUS LG XLONG (DISPOSABLE) ×3 IMPLANT
GOWN STRL REUS W/ TWL LRG LVL3 (GOWN DISPOSABLE) IMPLANT
GOWN STRL REUS W/TWL LRG LVL3 (GOWN DISPOSABLE) ×3
GOWN STRL REUS W/TWL XL LVL3 (GOWN DISPOSABLE) ×2 IMPLANT
GUIDEWIRE 0.038 PTFE COATED (WIRE) IMPLANT
GUIDEWIRE ANG ZIPWIRE 038X150 (WIRE) IMPLANT
GUIDEWIRE STR DUAL SENSOR (WIRE) ×3 IMPLANT
IV NS 1000ML (IV SOLUTION) ×3
IV NS 1000ML BAXH (IV SOLUTION) IMPLANT
IV NS IRRIG 3000ML ARTHROMATIC (IV SOLUTION) ×3 IMPLANT
KIT BALLIN UROMAX 15FX10 (LABEL) IMPLANT
KIT BALLN UROMAX 15FX4 (MISCELLANEOUS) IMPLANT
KIT BALLN UROMAX 26 75X4 (MISCELLANEOUS)
NS IRRIG 500ML POUR BTL (IV SOLUTION) IMPLANT
PACK CYSTO (CUSTOM PROCEDURE TRAY) ×3 IMPLANT
SET HIGH PRES BAL DIL (LABEL)
SHEATH ACCESS URETERAL 38CM (SHEATH) IMPLANT
STENT URET 6FRX26 CONTOUR (STENTS) ×2 IMPLANT

## 2014-07-31 NOTE — Transfer of Care (Signed)
Immediate Anesthesia Transfer of Care Note  Patient: Christopher MoralesSteven T Gordon  Procedure(s) Performed: Procedure(s) (LRB): URETEROSCOPY WITH HOLMIUM LASER LITHOTRIPSY (Left) CYSTOSCOPY WITH STENT PLACEMENT (Left)  Patient Location: PACU  Anesthesia Type: General  Level of Consciousness: awake, sedated, patient cooperative and responds to stimulation  Airway & Oxygen Therapy: Patient Spontanous Breathing and Patient connected to face mask oxygen  Post-op Assessment: Report given to PACU RN, Post -op Vital signs reviewed and stable and Patient moving all extremities  Post vital signs: Reviewed and stable  Complications: No apparent anesthesia complications

## 2014-07-31 NOTE — Op Note (Signed)
NAMLenice Christopher:  Gordon, Daily                 ACCOUNT NO.:  [id number]  MEDICAL RECORD NO.:  [id number]  LOCATION:                                 FACILITY:  PHYSICIAN:  Excell SeltzerJohn J. Annabell HowellsWrenn, M.D.    DATE OF BIRTH:  12/02/1955  DATE OF PROCEDURE:  07/31/2014 DATE OF DISCHARGE:  07/31/2014                              OPERATIVE REPORT   PATIENT OF:  Excell SeltzerJohn J. Annabell HowellsWrenn, M.D.  PROCEDURE: 1. Cystoscopy with left retrograde pyelogram and interpretation. 2. Left ureteroscopic stone extraction with holmium lasertripsy. 3. Insertion of left double-J stent.  PREOPERATIVE DIAGNOSIS:  Left distal ureteral stone.  POSTOPERATIVE DIAGNOSIS:  Left distal ureteral stone.  SURGEON:  Excell SeltzerJohn J. Annabell HowellsWrenn, M.D.  ANESTHESIA:  General.  DRAINS:  A 6-French 26-cm double-J stent.  SPECIMEN:  Stone fragments.  BLOOD LOSS:  Minimal.  COMPLICATIONS:  None.  INDICATIONS:  Christopher Gordon is a 59 year old white male with a history of stones who has approximately a 6 mm left ureteral stone at the junction of mid and distal ureters that has not progressed over the last several weeks.  It was felt after reviewing the options that ureteroscopy was most appropriate.  FINDINGS AND PROCEDURE:  He was taken to the operating room where he was given Cipro.  A general anesthetic was induced.  He was placed in lithotomy position and fitted with PAS hose.  His perineum and genitalia were prepped with Betadine solution and draped in usual sterile fashion.  Cystoscopy was performed using the 23-French scope and 30-degree lens. Examination revealed a normal urethra.  The external sphincter was intact.  The prostatic urethra was short without obstruction.  Bladder neck was slightly high.  Examination of bladder revealed mild trabeculation.  No tumors, stones, or inflammation were noted.  The ureteral orifices were unremarkable.  The left ureteral orifice was cannulated with a 5-French open-end catheter and contrast was instilled.  This revealed  a very narrowed distal ureter with a filling defect in the upper distal ureter consistent with a stone and mild proximal dilation.  Once the retrograde pyelogram was performed, a guidewire was passed to the kidney without difficulty by the stone and the cystoscope was removed.  A 38 cm access sheath inner coil was used to attempt to dilate the distal ureter.  However, it would not easily pass beyond the meatus.  At this point, the 4.5-French semi-rigid ureteroscope was passed alongside the wire.  I was able to negotiate it successfully up the distal ureter to the level of the stone.  There was some paleness of the mucosa of the intramural ureter, that was suggestive of some stricturing and he does have a history of prior stone passage.  Once the scope was at the level of the stone, the stone was engaged with a 200 micron laser fiber set on initially on 0.5 watts and 10 hertz, I increased the power to 1 watt because of the stone durability.  The stone then fragmented readily in manageable fragments which were then removed with an NGage basket to the bladder.  Once all significant stone fragments were removed and only minor dust remained in the ureter, the ureteroscope was removed.  The cystoscope was reinserted over the wire and a 6-French 26-cm double- J stent with string was inserted to the kidney under fluoroscopic guidance.  The wire was removed leaving good coil in the kidney and good coil in the bladder.  The bladder was then drained with removal of the stone fragments with a drainage.  Final inspection revealed no residual stone fragments.  The cystoscope was removed.  There were some minor anterior urethral mucosal tearing in the bulbar urethra with a little bit of oozing but no other significant findings were noted.  Once the scope was removed, the urethra was instilled with 10 mL of 2% lidocaine jelly.  The stent string was secured to the penis.  The patient was taken  down from lithotomy position.  His anesthetic was reversed.  He was moved to recovery room in stable condition.  The stones were given to his wife to bring to the office for later analysis.  There were no complications.     Excell Seltzer. Annabell Howells, M.D.     JJW/MEDQ  D:  07/31/2014  T:  07/31/2014  Job:  [id number]

## 2014-07-31 NOTE — Anesthesia Postprocedure Evaluation (Signed)
  Anesthesia Post-op Note  Patient: Christopher MoralesSteven T Gordon  Procedure(s) Performed: Procedure(s): URETEROSCOPY WITH HOLMIUM LASER LITHOTRIPSY (Left) CYSTOSCOPY WITH STENT PLACEMENT (Left)  Patient Location: PACU  Anesthesia Type:General  Level of Consciousness: awake and alert   Airway and Oxygen Therapy: Patient Spontanous Breathing  Post-op Pain: none  Post-op Assessment: Post-op Vital signs reviewed  Post-op Vital Signs: Reviewed  Last Vitals:  Filed Vitals:   07/31/14 1045  BP: 128/72  Pulse: 51  Temp: 36.4 C  Resp: 10    Complications: No apparent anesthesia complications

## 2014-07-31 NOTE — Interval H&P Note (Signed)
History and Physical Interval Note:  I called him yesterday to discuss options for therapy including ESWL and ureteroscopy and he elected to proceed with ureteroscopy.  Risks and possible need for stent reviewed.  07/31/2014 7:26 AM  Christopher MoralesSteven T Gordon  has presented today for surgery, with the diagnosis of LEFT DISTAL STONE  The various methods of treatment have been discussed with the patient and family. After consideration of risks, benefits and other options for treatment, the patient has consented to  Procedure(s): URETEROSCOPY WITH HOLMIUM LASER LITHOTRIPSY (Left) CYSTOSCOPY WITH STENT PLACEMENT (Left) as a surgical intervention .  The patient's history has been reviewed, patient examined, no change in status, stable for surgery.  I have reviewed the patient's chart and labs.  Questions were answered to the patient's satisfaction.     Juliette Standre J

## 2014-07-31 NOTE — Brief Op Note (Signed)
07/31/2014  8:16 AM  PATIENT:  Christopher Gordon  59 y.o. male  PRE-OPERATIVE DIAGNOSIS:  LEFT DISTAL STONE  POST-OPERATIVE DIAGNOSIS:  LEFT DISTAL STONE  PROCEDURE:  Procedure(s): URETEROSCOPY WITH HOLMIUM LASER LITHOTRIPSY (Left) CYSTOSCOPY WITH STENT PLACEMENT (Left)  SURGEON:  Surgeon(s) and Role:    * Bjorn PippinJohn Sandhya Denherder, MD - Primary  PHYSICIAN ASSISTANT:   ASSISTANTS: none   ANESTHESIA:   general  EBL:  Total I/O In: 100 [I.V.:100] Out: -   BLOOD ADMINISTERED:none  DRAINS: 6 x 26 Left JJ stent with string   LOCAL MEDICATIONS USED:  LIDOCAINE  and Amount: 10 ml Jelly  SPECIMEN:  Source of Specimen:  stone fragments  DISPOSITION OF SPECIMEN:  To family to bring to the office.   COUNTS:  YES  TOURNIQUET:  * No tourniquets in log *  DICTATION: .Other Dictation: Dictation Number [id number]  PLAN OF CARE: Discharge to home after PACU  PATIENT DISPOSITION:  PACU - hemodynamically stable.   Delay start of Pharmacological VTE agent (>24hrs) due to surgical blood loss or risk of bleeding: not applicable

## 2014-07-31 NOTE — Anesthesia Postprocedure Evaluation (Signed)
Immediate Anesthesia Transfer of Care Note  Patient: Christopher Gordon  Procedure(s) Performed: Procedure(s) (LRB): URETEROSCOPY WITH HOLMIUM LASER LITHOTRIPSY (Left) CYSTOSCOPY WITH STENT PLACEMENT (Left)  Patient Location: PACU  Anesthesia Type: General  Level of Consciousness: awake, sedated, patient cooperative and responds to stimulation  Airway & Oxygen Therapy: Patient Spontanous Breathing and Patient connected to face mask oxygen  Post-op Assessment: Report given to PACU RN, Post -op Vital signs reviewed and stable and Patient moving all extremities  Post vital signs: Reviewed and stable  Complications: No apparent anesthesia complications 

## 2014-07-31 NOTE — Discharge Instructions (Addendum)
Ureteral Stent Implantation Ureteral stent implantation is the implantation of a soft plastic tube with multiple holes into the tube that drains urine from your kidney to your bladder (ureter). The stent helps drain your kidney when there is a blockage of the flow of urine in your ureter. The stent has a coil on each end to keep it from falling out. One end stays in the kidney. The other end stays in the bladder. It is most often taken out after any blockage has been removed or your ureter has healed. Short-term stents have a string attached to make removal quite easy. Removal of a short-term stent can be done in your health care provider's office or by you at home. Long-term stents need to be changed every few months. LET [facility]OUR [facility] CARE PROVIDER KNOW ABOUT:  Any allergies you have.  All medicines you are taking, including vitamins, herbs, eye drops, creams, and over-the-counter medicines.  Previous problems you or members of your family have had with the use of anesthetics.  Any blood disorders you have.  Previous surgeries you have had.  Medical conditions you have. RISKS AND COMPLICATIONS Generally, ureteral stent implantation is a safe procedure. However, as with any procedure, complications can occur. Possible complications include:  Movement of the stent away from where it was originally placed (migration). This may affect the ability of the stent to properly drain your kidney. If migration of the stent occurs, the stent may need to be replaced or repositioned.  Perforation of the ureter.  Infection. BEFORE THE PROCEDURE  You may be asked to wash your genital area with sterile soap the morning of your procedure.  You may be given an oral antibiotic which you should take with a sip of water as prescribed by your health care provider.  You may be asked to not eat or drink for 8 hours before the surgery. PROCEDURE  First you will be given an anesthetic so you do not feel pain  during the procedure.  Your health care provider will insert a special lighted instrument called a cystoscope into your bladder. This allows your health care provider to see the opening to your ureter.  A thin wire is carefully threaded into your bladder and up the ureter. The stent is inserted over the wire and the wire is then removed.  Your bladder will be emptied of urine. AFTER THE PROCEDURE You will be taken to a recovery room until it is okay for you to go home. Document Released: 04/22/2000 Document Revised: 04/30/2013 Document Reviewed: 10/02/2012 [facility] Patient Information 2015 [employer] [place]. This information is not intended to replace advice given to you by your health care provider. Make sure you discuss any questions you have with your health care provider.  Please call for fever >101 or severe pain not controlled by medication. Expect some blood in the urine for the next few  Days.  You may remove the catheter by pulling the string on Monday morning.  If you don't feel comfortable with that the come to the office Monday.  If you start to leak urine, it is probably because the stent has become dislodged and it may need to be removed early.  Call if you have leakage.  Post Anesthesia Home Care Instructions  Activity: Get plenty of rest for the remainder of the day. A responsible adult should stay with you for 24 hours following the procedure.  For the next 24 hours, DO NOT: -Drive a car -Advertising copywriterperate machinery -Drink alcoholic beverages -  Take any medication unless instructed by your physician -Make any legal decisions or sign important papers.  Meals: Start with liquid foods such as gelatin or soup. Progress to regular foods as tolerated. Avoid greasy, spicy, heavy foods. If nausea and/or vomiting occur, drink only clear liquids until the nausea and/or vomiting subsides. Call your physician if vomiting continues.  Special Instructions/Symptoms: Your throat may feel dry or  sore from the anesthesia or the breathing tube placed in your throat during surgery. If this causes discomfort, gargle with warm salt water. The discomfort should disappear within 24 hours.

## 2014-07-31 NOTE — Anesthesia Procedure Notes (Signed)
Procedure Name: LMA Insertion Date/Time: 07/31/2014 7:37 AM Performed by: Jessica PriestBEESON, Christopher Gordon Pre-anesthesia Checklist: Patient identified, Emergency Drugs available, Suction available and Patient being monitored Patient Re-evaluated:Patient Re-evaluated prior to inductionOxygen Delivery Method: Circle System Utilized Preoxygenation: Pre-oxygenation with 100% oxygen Intubation Type: IV induction Ventilation: Mask ventilation without difficulty LMA: LMA inserted LMA Size: 4.0 Number of attempts: 1 Airway Equipment and Method: Bite block Placement Confirmation: positive ETCO2 Tube secured with: Tape Dental Injury: Teeth and Oropharynx as per pre-operative assessment

## 2014-07-31 NOTE — Anesthesia Preprocedure Evaluation (Addendum)
Anesthesia Evaluation  Patient identified by MRN, date of birth, ID band Patient awake    Reviewed: Allergy & Precautions, NPO status , Patient's Chart, lab work & pertinent test results  Airway Mallampati: III  TM Distance: >3 FB Neck ROM: Full    Dental  (+) Teeth Intact, Dental Advisory Given   Pulmonary former smoker,  breath sounds clear to auscultation        Cardiovascular negative cardio ROS  Rhythm:Regular Rate:Normal     Neuro/Psych negative neurological ROS     GI/Hepatic Neg liver ROS, GERD-  ,  Endo/Other  negative endocrine ROS  Renal/GU +Nephrolithiasis     Musculoskeletal  (+) Arthritis -,   Abdominal   Peds  Hematology negative hematology ROS (+)   Anesthesia Other Findings   Reproductive/Obstetrics                            Anesthesia Physical Anesthesia Plan  ASA: II  Anesthesia Plan: General   Post-op Pain Management:    Induction: Intravenous  Airway Management Planned: LMA  Additional Equipment:   Intra-op Plan:   Post-operative Plan: Extubation in OR  Informed Consent: I have reviewed the patients History and Physical, chart, labs and discussed the procedure including the risks, benefits and alternatives for the proposed anesthesia with the patient or authorized representative who has indicated his/her understanding and acceptance.   Dental advisory given  Plan Discussed with: CRNA  Anesthesia Plan Comments:         Anesthesia Quick Evaluation

## 2014-08-04 ENCOUNTER — Encounter (HOSPITAL_BASED_OUTPATIENT_CLINIC_OR_DEPARTMENT_OTHER): Payer: Self-pay | Admitting: Urology

## 2016-03-14 ENCOUNTER — Other Ambulatory Visit: Payer: Self-pay | Admitting: Family Medicine

## 2016-03-14 DIAGNOSIS — R42 Dizziness and giddiness: Secondary | ICD-10-CM

## 2016-05-16 ENCOUNTER — Other Ambulatory Visit: Payer: BLUE CROSS/BLUE SHIELD

## 2016-10-20 ENCOUNTER — Telehealth: Payer: Self-pay

## 2016-10-20 NOTE — Telephone Encounter (Signed)
NOTES SENT TO SCHEDULING.  °

## 2016-10-26 ENCOUNTER — Encounter: Payer: Self-pay | Admitting: Cardiology

## 2016-11-11 NOTE — Progress Notes (Signed)
.   Cardiology Office Note  NEW PATIENT VISIT  Date:  11/14/2016   ID:  Christopher Gordon, DOB 11-03-1955, MRN [id number]  PCP:  Christopher Hazel, MD  Cardiologist:  NEW  -Dr. Anne Gordon Had stress test with Dr. Anne Gordon 2012 low risk.   Chief Complaint  Patient presents with  . Bradycardia  . Chest Pain      History of Present Illness: Christopher Gordon is a 61 y.o. male who is being seen today for the evaluation of chest pain and bradycardia at the request of Christopher Hazel, MD.   Pt has hx of HTN more this year now controlled, HLD, C diff in 2006, kidney stones, ADD. Had stress test with Dr. Anne Gordon 2012 low risk.   Chest pain that he has is a tiny bit left of mid-sternal, occurs a few times a month and only lasts seconds.  No associated symptoms of nausea, SOB possible radiation to Lt shoulder but has had surgery on shoulder so not sure.  Sinus brady used to to be a runner.  He does have fatigue at times but no exercise currently.    No lightheadedness, no dizziness, no syncope.  He has no palpitations.  He eats pretty healthy.  On simvastatin and last LDL was 100.  He does have family hx of CAD in mother.  He is on ritalin for ADD.        Past Medical History:  Diagnosis Date  . ADD (attention deficit disorder)   . Arthritis   . GERD (gastroesophageal reflux disease)   . History of kidney stones   . Hyperlipidemia   . Left ureteral calculus   . Nephrolithiasis    right side calculi  . Wears glasses   . Wears partial dentures    UPPER AND LOWER    Past Surgical History:  Procedure Laterality Date  . CYSTOSCOPY WITH STENT PLACEMENT Left 07/31/2014   Procedure: CYSTOSCOPY WITH STENT PLACEMENT;  Surgeon: Bjorn Pippin, MD;  Location: [facility];  Service: Urology;  Laterality: Left;  . 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 SHOULDER OPEN ROTATOR CUFF REPAIR Left 2011     Current Outpatient Prescriptions  Medication Sig Dispense Refill  . aspirin EC 81 MG tablet  Take 81 mg by mouth daily.    Marland Kitchen losartan (COZAAR) 50 MG tablet Take 50 mg by mouth daily.     . meloxicam (MOBIC) 15 MG tablet Take 15 mg by mouth as needed for pain.     . methylphenidate (RITALIN) 20 MG tablet Take 20 mg by mouth 3 (three) times daily with meals.     . Multiple Vitamins-Minerals (MULTIVITAMIN ADULTS 50+ PO) Take 1 tablet by mouth daily.    Marland Kitchen omeprazole (PRILOSEC) 20 MG capsule Take 20 mg by mouth 3 times/day as needed-between meals & bedtime (HEARTBURN).     . potassium citrate (UROCIT-K) 10 MEQ (1080 MG) SR tablet Take 10 mEq by mouth 2 (two) times daily.    . simvastatin (ZOCOR) 40 MG tablet Take 40 mg by mouth every evening.      No current facility-administered medications for this visit.     Allergies:   Crestor [rosuvastatin] and Demerol [meperidine]    Social History:  The patient  reports that he quit smoking about 30 years ago. His smoking use included Cigarettes. He quit after 9.00 years of use. He quit smokeless tobacco use about 28 years ago. His smokeless tobacco use included Snuff. He  reports that he does not drink alcohol or use drugs.   Family History:  The patient's family history includes Alzheimer's disease in his father; Diabetes in his brother, father, and mother; Heart attack in his maternal grandmother, paternal grandfather, and paternal grandmother; Heart disease in his mother; Hyperlipidemia in his mother; Hypertension in his brother, father, and mother.    ROS:  General:no colds or fevers, no weight changes Skin:no rashes or ulcers HEENT:no blurred vision, no congestion CV:see HPI PUL:see HPI GI:no diarrhea constipation or melena, no indigestion GU:no hematuria, no dysuria, history of over 100 kidney stones now on K+ citrate and only 1 in 8 years. MS:+ arthritis. no claudication Neuro:no syncope, no lightheadedness Endo:no diabetes, no thyroid disease  Wt Readings from Last 3 Encounters:  11/14/16 177 lb 1.6 oz (80.3 kg)  07/31/14 177 lb  (80.3 kg)  05/04/14 177 lb (80.3 kg)     PHYSICAL EXAM: VS:  BP 120/70 (BP Location: Left Arm, Patient Position: Sitting, Cuff Size: Normal)   Pulse (!) 49   Ht 5\' 6"  (1.676 m)   Wt 177 lb 1.6 oz (80.3 kg)   BMI 28.58 kg/m  , BMI Body mass index is 28.58 kg/m. General:Pleasant affect, NAD Skin:Warm and dry, brisk capillary refill HEENT:normocephalic, sclera clear, mucus membranes moist Neck:supple, no JVD, no bruits  Heart:S1S2 RRR without murmur, gallup, rub or click Lungs:clear without rales, rhonchi, or wheezes ZOX:WRUEAbd:soft, non tender, + BS, do not palpate liver spleen or masses Ext:no lower ext edema, 2+ pedal pulses, 2+ radial pulses Neuro:alert and oriented x 3, MAE, follows commands, + facial symmetry    EKG:  EKG is ordered today. The ekg ordered today demonstrates marked S Brady, 49 early repol no significant change from EKG in 2012.     Recent Labs: 11/14/2016: TSH 0.730  See scanned report 10/19/16 Glucose 98, BUN 17, Cr. 0.93, K+ 4.3 LFTs normal.   LDL 100 Hgb 15.5, Hct 48 TSH in Oct 2017 0.67   Lipid Panel No results found for: CHOL, TRIG, HDL, CHOLHDL, VLDL, LDLCALC, LDLDIRECT     Other studies Reviewed: Additional studies/ records that were reviewed today include: previous EKG and [facility] nuc study that was neg in 2012, see scanned report and scanned EKG   ASSESSMENT AND PLAN:  1. Bradycardia at 49 - only symptom that may be related is fatigue.  Discussed with Dr. Eden EmmsNishan DOD will do POET to eval HR to check for chronotropic incompetence. Rare occurrence of slow pulse on ritalin, usually rapid HR.   2.  Chest pain, atypical will do POET, if abnormal will discuss with Dr. Anne FuSkains.   3.  HTN controlled  4. HLD, controlled on statin.  Followed by PCP  5. Fatigue may be due to no exercise, will check TSH, and will check POET Pt will follow up with Dr. Anne FuSkains  For HR especially - pt agreeable to recommendations.      Current medicines are reviewed with the  patient today.  The patient Has no concerns regarding medicines.  The following changes have been made:  See above Labs/ tests ordered today include:see above  Disposition:   Gordon:  see above  Signed, Nada BoozerLaura Sophie Quiles, NP  11/14/2016 5:19 PM    [facility] [address], [place], [place]  [address]/ [address] [place], [place] Phone: [phone]; Fax: [phone]) [phone]  [phone]

## 2016-11-14 ENCOUNTER — Ambulatory Visit (INDEPENDENT_AMBULATORY_CARE_PROVIDER_SITE_OTHER): Payer: BLUE CROSS/BLUE SHIELD | Admitting: Cardiology

## 2016-11-14 ENCOUNTER — Encounter: Payer: Self-pay | Admitting: Cardiology

## 2016-11-14 ENCOUNTER — Encounter (INDEPENDENT_AMBULATORY_CARE_PROVIDER_SITE_OTHER): Payer: Self-pay

## 2016-11-14 VITALS — BP 120/70 | HR 49 | Ht 66.0 in | Wt 177.1 lb

## 2016-11-14 DIAGNOSIS — E782 Mixed hyperlipidemia: Secondary | ICD-10-CM | POA: Diagnosis not present

## 2016-11-14 DIAGNOSIS — R079 Chest pain, unspecified: Secondary | ICD-10-CM

## 2016-11-14 DIAGNOSIS — R001 Bradycardia, unspecified: Secondary | ICD-10-CM

## 2016-11-14 DIAGNOSIS — I10 Essential (primary) hypertension: Secondary | ICD-10-CM | POA: Diagnosis not present

## 2016-11-14 DIAGNOSIS — R5383 Other fatigue: Secondary | ICD-10-CM | POA: Diagnosis not present

## 2016-11-14 LAB — TSH: TSH: 0.73 u[IU]/mL (ref 0.450–4.500)

## 2016-11-14 NOTE — Patient Instructions (Signed)
Medication Instructions:  Your physician recommends that you continue on your current medications as directed. Please refer to the Current Medication list given to you today.   Labwork: Your physician recommends that you return for lab work today for TSH   Testing/Procedures:  Please schedule a treadmill exercise test  Follow-Up: Your physician recommends that you schedule a follow-up appointment in: follow up with Dr. Anne FuSkains first available.   Any Other Special Instructions Will Be Listed Below (If Applicable).     If you need a refill on your cardiac medications before your next appointment, please call your pharmacy.

## 2016-11-23 ENCOUNTER — Ambulatory Visit (INDEPENDENT_AMBULATORY_CARE_PROVIDER_SITE_OTHER): Payer: BLUE CROSS/BLUE SHIELD

## 2016-11-23 DIAGNOSIS — R079 Chest pain, unspecified: Secondary | ICD-10-CM | POA: Diagnosis not present

## 2016-11-23 LAB — EXERCISE TOLERANCE TEST
CHL RATE OF PERCEIVED EXERTION: 17
CSEPED: 11 min
CSEPEW: 13.4 METS
CSEPHR: 101 %
Exercise duration (sec): 0 s
MPHR: 161 {beats}/min
Peak HR: 162 {beats}/min
Rest HR: 44 {beats}/min

## 2016-11-24 ENCOUNTER — Telehealth: Payer: Self-pay | Admitting: *Deleted

## 2016-11-24 DIAGNOSIS — R9439 Abnormal result of other cardiovascular function study: Secondary | ICD-10-CM

## 2016-11-24 NOTE — Telephone Encounter (Signed)
-----   Message from Leone BrandLaura R Ingold, NP sent at 11/24/2016  7:44 AM EDT ----- Please let pt know that Dr. Anne FuSkains reviewed and recommends coronary CT with Ca+ score and FFR with abnormal EKG changes of stress test.  The CT will look for coronary disease and flow issues thanks.

## 2016-12-05 ENCOUNTER — Telehealth: Payer: Self-pay | Admitting: Cardiology

## 2016-12-05 NOTE — Telephone Encounter (Signed)
Please let pt know that Dr. Anne FuSkains reviewed and recommends coronary CT with Ca+ score and FFR with abnormal EKG changes of stress test.  The CT will look for coronary disease and flow issues thanks.  Spent over 20 mins with this pt providing the rationale as of why Dr Anne FuSkains ordered for him to have a coronary ct with FFR done.  Thoroughly went over what a coronary ct will look at.  Pt verbalized understanding and agrees with this plan.  Pt more than gracious for all the time spent going over this.

## 2016-12-05 NOTE — Telephone Encounter (Signed)
Returned pts call and re-explained to him the reason for the Cardiac CT. Pt verbalized understanding and appreciation for the call.

## 2016-12-05 NOTE — Telephone Encounter (Signed)
What to know why he has to have a cardiac ct

## 2016-12-09 ENCOUNTER — Encounter: Payer: Self-pay | Admitting: Cardiology

## 2016-12-16 ENCOUNTER — Ambulatory Visit (HOSPITAL_COMMUNITY)
Admission: RE | Admit: 2016-12-16 | Discharge: 2016-12-16 | Disposition: A | Discharge status: Home / Self Care | Payer: BLUE CROSS/BLUE SHIELD | Source: Ambulatory Visit | Attending: Cardiology | Admitting: Cardiology

## 2016-12-16 ENCOUNTER — Encounter (HOSPITAL_COMMUNITY): Payer: Self-pay

## 2016-12-16 DIAGNOSIS — I2510 Atherosclerotic heart disease of native coronary artery without angina pectoris: Secondary | ICD-10-CM | POA: Diagnosis not present

## 2016-12-16 DIAGNOSIS — R9439 Abnormal result of other cardiovascular function study: Secondary | ICD-10-CM | POA: Insufficient documentation

## 2016-12-16 DIAGNOSIS — R079 Chest pain, unspecified: Secondary | ICD-10-CM | POA: Diagnosis not present

## 2016-12-16 MED ORDER — METOPROLOL TARTRATE 5 MG/5ML IV SOLN
5.0000 mg | Freq: Once | INTRAVENOUS | Status: AC
  Administered 2016-12-16: 5 mg via INTRAVENOUS

## 2016-12-16 MED ORDER — METOPROLOL TARTRATE 5 MG/5ML IV SOLN
INTRAVENOUS | Status: AC
  Filled 2016-12-16: qty 5

## 2016-12-16 MED ORDER — IOPAMIDOL (ISOVUE-370) INJECTION 76%
INTRAVENOUS | Status: AC
  Administered 2016-12-16: 80 mL
  Filled 2016-12-16: qty 100

## 2016-12-16 MED ORDER — NITROGLYCERIN 0.4 MG SL SUBL
SUBLINGUAL_TABLET | SUBLINGUAL | Status: AC
  Filled 2016-12-16: qty 2

## 2016-12-16 MED ORDER — NITROGLYCERIN 0.4 MG SL SUBL
0.8000 mg | SUBLINGUAL_TABLET | Freq: Once | SUBLINGUAL | Status: AC
  Administered 2016-12-16: 0.8 mg via SUBLINGUAL

## 2016-12-19 ENCOUNTER — Telehealth: Payer: Self-pay | Admitting: Cardiology

## 2016-12-19 NOTE — Telephone Encounter (Signed)
Patient calling, would like to know if he can the CT results as he will going out of town this week and needs to know if the results will change his plans.

## 2016-12-19 NOTE — Telephone Encounter (Signed)
Will forward to Dr Skains. 

## 2016-12-19 NOTE — Telephone Encounter (Signed)
Dr Anne FuSkains and Nada BoozerLaura Ingold, NP out of office this week.   Dr Katrinka BlazingSmith reviewed Coronary CT done 12/16/16--non obstructive CAD, should be okay to go out of town. Pt advised Dr Katrinka BlazingSmith reviewed report -non-obstructive CAD, should be okay to go out of town, keep follow-up appt with Dr Anne FuSkains in September 2018.   Pt advised Dr Anne FuSkains has not reviewed report, may be first of week before report finalized by  Dr Anne FuSkains, will follow-up with pt after Dr Anne FuSkains finalizes report.

## 2016-12-20 ENCOUNTER — Telehealth: Payer: Self-pay | Admitting: *Deleted

## 2016-12-20 NOTE — Telephone Encounter (Signed)
-----   Message from Allayne ButcherBrittainy M Simmons, [place] sent at 12/19/2016  3:21 PM EDT ----- Coronary CTA shows mild CAD. Nonobstructive disease. Continue risk factor modification, ASA, statin (simvastatin) for cholesterol and losartan for BP control. Continue f/u with Dr. Anne FuSkains.

## 2016-12-20 NOTE — Telephone Encounter (Signed)
Patient informed. 

## 2016-12-23 NOTE — Telephone Encounter (Signed)
Agree. OK to go out of town. Continue with medical mgt.  Donato Schultz, MD

## 2016-12-23 NOTE — Telephone Encounter (Signed)
Pt aware.

## 2017-02-01 ENCOUNTER — Ambulatory Visit (INDEPENDENT_AMBULATORY_CARE_PROVIDER_SITE_OTHER): Payer: BLUE CROSS/BLUE SHIELD | Admitting: Cardiology

## 2017-02-01 ENCOUNTER — Encounter: Payer: Self-pay | Admitting: Cardiology

## 2017-02-01 VITALS — BP 144/88 | HR 96 | Ht 66.5 in | Wt 180.0 lb

## 2017-02-01 DIAGNOSIS — E782 Mixed hyperlipidemia: Secondary | ICD-10-CM | POA: Diagnosis not present

## 2017-02-01 DIAGNOSIS — R9439 Abnormal result of other cardiovascular function study: Secondary | ICD-10-CM

## 2017-02-01 DIAGNOSIS — R001 Bradycardia, unspecified: Secondary | ICD-10-CM

## 2017-02-01 DIAGNOSIS — I10 Essential (primary) hypertension: Secondary | ICD-10-CM

## 2017-02-01 MED ORDER — ATORVASTATIN CALCIUM 40 MG PO TABS: 40.0000 mg | ORAL_TABLET | Freq: Every day | ORAL | 3 refills | Status: DC

## 2017-02-01 MED ORDER — ATORVASTATIN CALCIUM 40 MG PO TABS: 40.0000 mg | ORAL_TABLET | Freq: Every day | ORAL | 1 refills | Status: DC

## 2017-02-01 NOTE — Patient Instructions (Signed)
Medication Instructions:  Please stop your Simvastatin and start Atorvastatin 40 mg a day. Continue all other medications as listed.  Follow-Up: Follow up as needed with Dr Anne Fu.  Thank you for choosing  [facility]!!

## 2017-02-01 NOTE — Progress Notes (Signed)
Cardiology Office Note:    Date:  02/01/2017   ID:  Christopher Gordon, DOB 28-Sep-1955, MRN [id number]  PCP:  Sigmund Hazel, MD  Cardiologist:  Donato Schultz, MD    Referring MD: Sigmund Hazel, MD   Here for follow-up of stress testing/CT scan  History of Present Illness:    Christopher Gordon is a 61 y.o. male with a hx of Recent exercise tolerance test on 11/23/16 as follows:  Blood pressure demonstrated a normal response to exercise.  Upsloping ST segment depression ST segment depression of 1.5 mm was noted during stress in the II, V4 and V5 leads, beginning at 8 minutes of stress, ending at 13 minutes of stress, and returning to baseline after less than 1 minute of recovery.   ECG findings suspicious for ischemia. Consider a coronary CT for further evaluation.  Hypertensive response to exertion.  Excellent exercise capacity.  Who then underwent a CT coronary on 12/16/16 as follows:  IMPRESSION: 1. Coronary calcium score of 16. This was 86 percentile for age and sex matched control.  2. Normal coronary origin with right dominance.  3. Minimal non-obstructive plaque in the proximal LAD and RCA.  Christopher Gordon  Here for follow-up. He has been feeling some mild shortness of breath. No chest pain. No syncope. HR was as low as 42 at times. This is how he has been most of his life. Bradycardia. Sometimes tired. Currently on statin medication and losartan for blood pressure control. 7 grandchildren  NSAID increased BP. Started losartan. At home 135/80.   Past Medical History:  Diagnosis Date  . ADD (attention deficit disorder)   . Arthritis   . GERD (gastroesophageal reflux disease)   . History of kidney stones   . Hyperlipidemia   . Left ureteral calculus   . Nephrolithiasis    right side calculi  . Wears glasses   . Wears partial dentures    UPPER AND LOWER    Past Surgical History:  Procedure Laterality Date  . CYSTOSCOPY WITH STENT PLACEMENT Left 07/31/2014   Procedure: CYSTOSCOPY WITH STENT PLACEMENT;  Surgeon: Bjorn Pippin, MD;  Location: [facility];  Service: Urology;  Laterality: Left;  . 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 SHOULDER OPEN ROTATOR CUFF REPAIR Left 2011    Current Medications: Current Meds  Medication Sig  . aspirin EC 81 MG tablet Take 81 mg by mouth daily.  Marland Kitchen losartan (COZAAR) 50 MG tablet Take 50 mg by mouth daily.   . meloxicam (MOBIC) 15 MG tablet Take 15 mg by mouth as needed for pain.   . methylphenidate (RITALIN) 20 MG tablet Take 20 mg by mouth 3 (three) times daily with meals.   . Multiple Vitamins-Minerals (MULTIVITAMIN ADULTS 50+ PO) Take 1 tablet by mouth daily.  Marland Kitchen omeprazole (PRILOSEC) 20 MG capsule Take 20 mg by mouth 3 times/day as needed-between meals & bedtime (HEARTBURN).   . potassium citrate (UROCIT-K) 10 MEQ (1080 MG) SR tablet Take 10 mEq by mouth 2 (two) times daily.  . [DISCONTINUED] simvastatin (ZOCOR) 40 MG tablet Take 40 mg by mouth every evening.      Allergies:   Crestor [rosuvastatin] and Demerol [meperidine]   Social History   Social History  . Marital status: Married    Spouse name: N/A  . Number of children: N/A  . Years of education: N/A   Social History Main Topics  . Smoking status: Former Smoker    Years: 9.00  Types: Cigarettes    Quit date: 07/30/1986  . Smokeless tobacco: Former Neurosurgeon    Types: Snuff    Quit date: 07/29/1988  . Alcohol use No  . Drug use: No  . Sexual activity: Not Asked   Other Topics Concern  . None   Social History Narrative  . None     Family History: The patient's family history includes Alzheimer's disease in his father; Diabetes in his brother, father, and mother; Heart attack in his maternal grandmother, paternal grandfather, and paternal grandmother; Heart disease in his mother; Hyperlipidemia in his mother; Hypertension in his brother, father, and mother. ROS:   Please see the history of present illness.    Has  occasional back pain, knee pain from years of running. All other systems reviewed and are negative.  EKGs/Labs/Other Studies Reviewed:    The following studies were reviewed today: CT scan, exercise treadmill test personally reviewed with him.  EKG:  EKG is not ordered today.    Recent Labs: 11/14/2016: TSH 0.730  Recent Lipid Panel No results found for: CHOL, TRIG, HDL, CHOLHDL, VLDL, LDLCALC, LDLDIRECT  Physical Exam:    VS:  BP (!) 144/88   Pulse 96   Ht 5' 6.5" (1.689 m)   Wt 180 lb (81.6 kg)   BMI 28.62 kg/m     Wt Readings from Last 3 Encounters:  02/01/17 180 lb (81.6 kg)  11/14/16 177 lb 1.6 oz (80.3 kg)  07/31/14 177 lb (80.3 kg)     GEN:  Well nourished, well developed in no acute distress HEENT: Normal NECK: No JVD; No carotid bruits LYMPHATICS: No lymphadenopathy CARDIAC: Bradycardic RR, no murmurs, rubs, gallops RESPIRATORY:  Clear to auscultation without rales, wheezing or rhonchi  ABDOMEN: Soft, non-tender, non-distended MUSCULOSKELETAL:  No edema; No deformity  SKIN: Warm and dry NEUROLOGIC:  Alert and oriented x 3 PSYCHIATRIC:  Normal affect   ASSESSMENT:    1. Abnormal stress ECG with treadmill   2. Essential hypertension   3. Mixed hyperlipidemia   4. Bradycardia    PLAN:    In order of problems listed above:  Bradycardia  - Asymptomatic, no syncope. He has good chronotropic competence, in other words he is able to increase his heart rate appropriately on exercise. No need for pacemaker or further intervention. Avoid beta blockers. TSH was normal.  False positive exercise treadmill test  - ST segment depressions were noted treadmill test, false positive. Described 10. Coronary artery CT scan was reassuring showing only minimal coronary artery plaque, nonobstructive. Continue with aggressive secondary prevention. Statin, blood pressure control, diet, exercise.  Hyperlipidemia  - We are going to change him from simvastatin 40 mg to  atorvastatin 40 mg for improved efficacy and to minimize chances of drug to drug interaction. He has been taking simvastatin well without any problems. Last LDL 100.  Essential hypertension  - Continue with losartan. Could increase to 100 mg if necessary. Next step could be to add a low-dose diuretic such as chlorthalidone.  If any worrisome symptoms develop, please let us know, I will be happy to see him back in clinic on an as-needed basis.   Medication Adjustments/Labs and Tests Ordered: Current medicines are reviewed at length with the patient today.  Concerns regarding medicines are outlined above.  No orders of the defined types were placed in this encounter.  Meds ordered this encounter  Medications  . DISCONTD: atorvastatin (LIPITOR) 40 MG tablet    Sig: Take 1 tablet (40 mg total)  by mouth daily.    Dispense:  30 tablet    Refill:  1  . atorvastatin (LIPITOR) 40 MG tablet    Sig: Take 1 tablet (40 mg total) by mouth daily.    Dispense:  90 tablet    Refill:  3    Signed, Donato Schultz, MD  02/01/2017 9:20 AM    [facility]

## 2017-04-24 ENCOUNTER — Telehealth: Payer: Self-pay | Admitting: *Deleted

## 2017-04-24 NOTE — Telephone Encounter (Signed)
Received telephone call from Darl PikesSusan at Dr Wynonia LawmanLisa Miller's office.  She is calling to report pt had an EKG there that demonstrates NSR with 2 pauses.  She is requesting it be reviewed and someone see the pt ASAP to determine if patient needs a 30 day event monitor.  Advised will need copy of EKG and office note from today to have MD review.  Darl PikesSusan states she will fax it over.

## 2017-04-25 NOTE — Telephone Encounter (Signed)
EKG received and reviewed w/ DOD, Dr. Katrinka BlazingSmith. No pause noted on monitor, only blocked PAC/s. Informed Darl PikesSusan at Dr. Rondel BatonMiller's office.  Verified pt not having any issues/symptoms r/t EKG.  Informed Darl PikesSusan that pt did not need to be seen for EKG reading faxed to our office.

## 2017-04-25 NOTE — Telephone Encounter (Signed)
New message    Noreene LarssonJill is calling from Dr.Millers office and she will send the fax of the EKG to chart prep today at the attention of Triage

## 2017-10-27 DIAGNOSIS — E7800 Pure hypercholesterolemia, unspecified: Secondary | ICD-10-CM | POA: Diagnosis not present

## 2017-10-27 DIAGNOSIS — M2550 Pain in unspecified joint: Secondary | ICD-10-CM | POA: Diagnosis not present

## 2017-10-27 DIAGNOSIS — F900 Attention-deficit hyperactivity disorder, predominantly inattentive type: Secondary | ICD-10-CM | POA: Diagnosis not present

## 2017-10-27 DIAGNOSIS — R5383 Other fatigue: Secondary | ICD-10-CM | POA: Diagnosis not present

## 2017-10-27 DIAGNOSIS — E663 Overweight: Secondary | ICD-10-CM | POA: Diagnosis not present

## 2017-12-20 ENCOUNTER — Telehealth: Payer: Self-pay | Admitting: Cardiology

## 2017-12-20 ENCOUNTER — Telehealth: Payer: Self-pay

## 2017-12-20 NOTE — Telephone Encounter (Signed)
SENT REFERRAL TO SCHEDULING, FILED NOTES 

## 2017-12-20 NOTE — Telephone Encounter (Signed)
New message:      Patient has requested to get a second opinion from either Dr. Allyson SabalBerry, Dr. Duke Salviaandolph, or Dr. Katrinka BlazingSmith. Pt states he would like a second opinion and is currently a patient of Dr. Anne FuSkains

## 2017-12-20 NOTE — Telephone Encounter (Signed)
OK with me Mark Skains, MD  

## 2017-12-25 NOTE — Telephone Encounter (Signed)
Fine with me

## 2017-12-26 NOTE — Telephone Encounter (Signed)
Left message for pt to call to schedule

## 2017-12-27 NOTE — Telephone Encounter (Signed)
OK 

## 2017-12-28 NOTE — Telephone Encounter (Signed)
Left message for patient to call and schedule.

## 2018-01-09 ENCOUNTER — Other Ambulatory Visit: Payer: Self-pay | Admitting: Cardiology

## 2018-02-03 ENCOUNTER — Other Ambulatory Visit: Payer: Self-pay | Admitting: Cardiology

## 2018-02-08 ENCOUNTER — Other Ambulatory Visit: Payer: Self-pay | Admitting: Cardiology

## 2018-04-08 HISTORY — PX: KNEE SURGERY: SHX244

## 2018-04-09 DIAGNOSIS — Z0000 Encounter for general adult medical examination without abnormal findings: Secondary | ICD-10-CM | POA: Diagnosis not present

## 2018-04-09 DIAGNOSIS — Z23 Encounter for immunization: Secondary | ICD-10-CM | POA: Diagnosis not present

## 2018-04-09 DIAGNOSIS — E7800 Pure hypercholesterolemia, unspecified: Secondary | ICD-10-CM | POA: Diagnosis not present

## 2018-04-09 DIAGNOSIS — I10 Essential (primary) hypertension: Secondary | ICD-10-CM | POA: Diagnosis not present

## 2018-05-15 DIAGNOSIS — M65321 Trigger finger, right index finger: Secondary | ICD-10-CM | POA: Diagnosis not present

## 2018-10-15 DIAGNOSIS — F900 Attention-deficit hyperactivity disorder, predominantly inattentive type: Secondary | ICD-10-CM | POA: Diagnosis not present

## 2018-10-15 DIAGNOSIS — E7800 Pure hypercholesterolemia, unspecified: Secondary | ICD-10-CM | POA: Diagnosis not present

## 2018-10-15 DIAGNOSIS — I10 Essential (primary) hypertension: Secondary | ICD-10-CM | POA: Diagnosis not present

## 2018-10-15 DIAGNOSIS — N200 Calculus of kidney: Secondary | ICD-10-CM | POA: Diagnosis not present

## 2018-11-05 DIAGNOSIS — M79606 Pain in leg, unspecified: Secondary | ICD-10-CM | POA: Diagnosis not present

## 2018-11-21 DIAGNOSIS — I499 Cardiac arrhythmia, unspecified: Secondary | ICD-10-CM | POA: Diagnosis not present

## 2018-11-21 DIAGNOSIS — F411 Generalized anxiety disorder: Secondary | ICD-10-CM | POA: Diagnosis not present

## 2019-03-14 ENCOUNTER — Other Ambulatory Visit: Payer: Self-pay

## 2019-03-14 DIAGNOSIS — Z20822 Contact with and (suspected) exposure to covid-19: Secondary | ICD-10-CM

## 2019-03-16 LAB — NOVEL CORONAVIRUS, NAA: SARS-CoV-2, NAA: NOT DETECTED

## 2019-04-18 ENCOUNTER — Encounter (HOSPITAL_COMMUNITY): Payer: Self-pay

## 2019-04-18 ENCOUNTER — Emergency Department (HOSPITAL_COMMUNITY): Payer: BC Managed Care – PPO

## 2019-04-18 ENCOUNTER — Other Ambulatory Visit: Payer: Self-pay | Admitting: Urology

## 2019-04-18 ENCOUNTER — Other Ambulatory Visit: Payer: Self-pay

## 2019-04-18 ENCOUNTER — Emergency Department (HOSPITAL_COMMUNITY)
Admission: EM | Admit: 2019-04-18 | Discharge: 2019-04-18 | Disposition: A | Discharge status: Home / Self Care | Payer: BC Managed Care – PPO | Attending: Emergency Medicine | Admitting: Emergency Medicine

## 2019-04-18 DIAGNOSIS — Z87891 Personal history of nicotine dependence: Secondary | ICD-10-CM | POA: Insufficient documentation

## 2019-04-18 DIAGNOSIS — N201 Calculus of ureter: Secondary | ICD-10-CM | POA: Insufficient documentation

## 2019-04-18 DIAGNOSIS — N2889 Other specified disorders of kidney and ureter: Secondary | ICD-10-CM | POA: Insufficient documentation

## 2019-04-18 DIAGNOSIS — Z79899 Other long term (current) drug therapy: Secondary | ICD-10-CM | POA: Diagnosis not present

## 2019-04-18 DIAGNOSIS — D49512 Neoplasm of unspecified behavior of left kidney: Secondary | ICD-10-CM | POA: Diagnosis not present

## 2019-04-18 DIAGNOSIS — Z7982 Long term (current) use of aspirin: Secondary | ICD-10-CM | POA: Insufficient documentation

## 2019-04-18 DIAGNOSIS — N202 Calculus of kidney with calculus of ureter: Secondary | ICD-10-CM | POA: Diagnosis not present

## 2019-04-18 DIAGNOSIS — R109 Unspecified abdominal pain: Secondary | ICD-10-CM | POA: Diagnosis not present

## 2019-04-18 LAB — CBC
HCT: 46.4 % (ref 39.0–52.0)
Hemoglobin: 15.7 g/dL (ref 13.0–17.0)
MCH: 30.7 pg (ref 26.0–34.0)
MCHC: 33.8 g/dL (ref 30.0–36.0)
MCV: 90.8 fL (ref 80.0–100.0)
Platelets: 231 10*3/uL (ref 150–400)
RBC: 5.11 MIL/uL (ref 4.22–5.81)
RDW: 12.4 % (ref 11.5–15.5)
WBC: 14.2 10*3/uL — ABNORMAL HIGH (ref 4.0–10.5)
nRBC: 0 % (ref 0.0–0.2)

## 2019-04-18 LAB — URINALYSIS, ROUTINE W REFLEX MICROSCOPIC
Bilirubin Urine: NEGATIVE
Glucose, UA: NEGATIVE mg/dL
Ketones, ur: 20 mg/dL — AB
Leukocytes,Ua: NEGATIVE
Nitrite: NEGATIVE
Protein, ur: 30 mg/dL — AB
RBC / HPF: >50 RBC/hpf — ABNORMAL HIGH (ref 0–5)
Specific Gravity, Urine: 1.023 (ref 1.005–1.030)
pH: 6 (ref 5.0–8.0)

## 2019-04-18 LAB — CBC WITH DIFFERENTIAL/PLATELET
Abs Immature Granulocytes: 0.06 10*3/uL (ref 0.00–0.07)
Basophils Absolute: 0.1 10*3/uL (ref 0.0–0.1)
Basophils Relative: 0 %
Eosinophils Absolute: 0 10*3/uL (ref 0.0–0.5)
Eosinophils Relative: 0 %
HCT: 46.1 % (ref 39.0–52.0)
Hemoglobin: 15.3 g/dL (ref 13.0–17.0)
Immature Granulocytes: 0 %
Lymphocytes Relative: 7 %
Lymphs Abs: 1 10*3/uL (ref 0.7–4.0)
MCH: 30.2 pg (ref 26.0–34.0)
MCHC: 33.2 g/dL (ref 30.0–36.0)
MCV: 90.9 fL (ref 80.0–100.0)
Monocytes Absolute: 1.2 10*3/uL — ABNORMAL HIGH (ref 0.1–1.0)
Monocytes Relative: 9 %
Neutro Abs: 11.3 10*3/uL — ABNORMAL HIGH (ref 1.7–7.7)
Neutrophils Relative %: 84 %
Platelets: 237 10*3/uL (ref 150–400)
RBC: 5.07 MIL/uL (ref 4.22–5.81)
RDW: 12.6 % (ref 11.5–15.5)
WBC: 13.6 10*3/uL — ABNORMAL HIGH (ref 4.0–10.5)
nRBC: 0 % (ref 0.0–0.2)

## 2019-04-18 LAB — BASIC METABOLIC PANEL
Anion gap: 12 (ref 5–15)
BUN: 21 mg/dL (ref 8–23)
CO2: 23 mmol/L (ref 22–32)
Calcium: 9.2 mg/dL (ref 8.9–10.3)
Chloride: 105 mmol/L (ref 98–111)
Creatinine, Ser: 1.37 mg/dL — ABNORMAL HIGH (ref 0.61–1.24)
GFR calc Af Amer: >60 mL/min (ref >60)
GFR calc non Af Amer: 55 mL/min — ABNORMAL LOW (ref >60)
Glucose, Bld: 132 mg/dL — ABNORMAL HIGH (ref 70–99)
Potassium: 4.1 mmol/L (ref 3.5–5.1)
Sodium: 140 mmol/L (ref 135–145)

## 2019-04-18 LAB — COMPREHENSIVE METABOLIC PANEL
ALT: 49 U/L — ABNORMAL HIGH (ref 0–44)
AST: 34 U/L (ref 15–41)
Albumin: 4.6 g/dL (ref 3.5–5.0)
Alkaline Phosphatase: 79 U/L (ref 38–126)
Anion gap: 10 (ref 5–15)
BUN: 21 mg/dL (ref 8–23)
CO2: 24 mmol/L (ref 22–32)
Calcium: 9.1 mg/dL (ref 8.9–10.3)
Chloride: 105 mmol/L (ref 98–111)
Creatinine, Ser: 1.39 mg/dL — ABNORMAL HIGH (ref 0.61–1.24)
GFR calc Af Amer: >60 mL/min (ref >60)
GFR calc non Af Amer: 54 mL/min — ABNORMAL LOW (ref >60)
Glucose, Bld: 135 mg/dL — ABNORMAL HIGH (ref 70–99)
Potassium: 4 mmol/L (ref 3.5–5.1)
Sodium: 139 mmol/L (ref 135–145)
Total Bilirubin: 0.7 mg/dL (ref 0.3–1.2)
Total Protein: 7.6 g/dL (ref 6.5–8.1)

## 2019-04-18 LAB — LIPASE, BLOOD: Lipase: 29 U/L (ref 11–51)

## 2019-04-18 MED ORDER — HYDROMORPHONE HCL 4 MG PO TABS: 2.0000 mg | ORAL_TABLET | ORAL | 0 refills | Status: DC | PRN

## 2019-04-18 MED ORDER — HYDROMORPHONE HCL 1 MG/ML IJ SOLN
1.0000 mg | Freq: Once | INTRAMUSCULAR | Status: AC
  Filled 2019-04-18: qty 1

## 2019-04-18 MED ORDER — HYDROMORPHONE HCL 1 MG/ML IJ SOLN
INTRAMUSCULAR | Status: AC
  Administered 2019-04-18: 1 mg via INTRAVENOUS
  Filled 2019-04-18: qty 1

## 2019-04-18 MED ORDER — SODIUM CHLORIDE 0.9% FLUSH
3.0000 mL | Freq: Once | INTRAVENOUS | Status: AC
  Administered 2019-04-18: 3 mL via INTRAVENOUS

## 2019-04-18 MED ORDER — ONDANSETRON HCL 4 MG/2ML IJ SOLN: 4.0000 mg | Freq: Once | INTRAMUSCULAR | Status: DC | PRN

## 2019-04-18 MED ORDER — ONDANSETRON 8 MG PO TBDP: 8.0000 mg | ORAL_TABLET | Freq: Three times a day (TID) | ORAL | 0 refills | Status: DC | PRN

## 2019-04-18 MED ORDER — ONDANSETRON 4 MG PO TBDP
4.0000 mg | ORAL_TABLET | Freq: Once | ORAL | Status: AC | PRN
  Administered 2019-04-18: 4 mg via ORAL
  Filled 2019-04-18: qty 1

## 2019-04-18 MED ORDER — HYDROMORPHONE HCL 1 MG/ML IJ SOLN
1.0000 mg | Freq: Once | INTRAMUSCULAR | Status: AC
  Administered 2019-04-18: 1 mg via INTRAVENOUS
  Filled 2019-04-18: qty 1

## 2019-04-18 NOTE — ED Notes (Signed)
Urine culture sent to the lab. 

## 2019-04-18 NOTE — ED Provider Notes (Signed)
[facility]-EMERGENCY DEPT Provider Note: Lowella Dell, MD, FACEP  CSN: [id number] MRN: [id number] ARRIVAL: 04/18/19 at 0010 ROOM: WA13/[facility]13   CHIEF COMPLAINT  Abdominal Pain   HISTORY OF PRESENT ILLNESS  04/18/19 1:32 AM Christopher Gordon is a 63 y.o. male with a history of kidney stones.  He is here with right flank pain radiating to the right lower quadrant that began yesterday morning about 7:30 AM.  The pain subsequently resolved but returned about 7:30 PM.  He rates the pain is a 10 out of 10 and describes it as like previous kidney stones.  It is not significantly changed with movement or position.  He has had associated nausea and vomiting, estimating about 6 episodes of vomiting.  He denies fever or chills.  He has not noted any hematuria.   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   SHOULDER OPEN ROTATOR CUFF REPAIR Left 2011    Family History  Problem Relation Age of Onset   Diabetes Mother    Hypertension Mother    Heart disease Mother        several stents   Hyperlipidemia Mother    Alzheimer's disease Father    Diabetes Father    Hypertension Father    Heart attack Maternal Grandmother    Heart attack Paternal Grandmother    Heart attack Paternal Grandfather    Diabetes Brother    Hypertension Brother     Social History   Tobacco Use   Smoking status: Former Smoker    Years: 9.00     Types: Cigarettes    Quit date: 07/30/1986    Years since quitting: 32.7   Smokeless tobacco: Former Neurosurgeon    Types: Snuff    Quit date: 07/29/1988  Substance Use Topics   Alcohol use: No   Drug use: No    Prior to Admission medications   Medication Sig Start Date End Date Taking? Authorizing Provider  aspirin EC 81 MG tablet Take 81 mg by mouth daily.    [provider]  atorvastatin (LIPITOR) 40 MG tablet TAKE 1 TABLET DAILY (PLEASE CALL OFFICE AND SCHEDULE OVERDUE YEARLY APPOINTMENT FOR FURTHER REFILLS. SECOND ATTEMPT) 02/08/18   Jake Bathe, MD  losartan (COZAAR) 50 MG tablet Take 50 mg by mouth daily.  09/01/16   [provider]  meloxicam (MOBIC) 15 MG tablet Take 15 mg by mouth as needed for pain.     [provider]  methylphenidate (RITALIN) 20 MG tablet Take 20 mg by mouth 3 (three) times daily with meals.  09/10/16   [provider]  Multiple Vitamins-Minerals (MULTIVITAMIN ADULTS 50+ PO) Take 1 tablet by mouth daily.    [provider]  omeprazole (PRILOSEC) 20 MG capsule Take 20 mg by mouth 3 times/day as needed-between meals & bedtime (HEARTBURN).     [provider]  potassium citrate (UROCIT-K) 10 MEQ (1080 MG) SR tablet Take 10 mEq by mouth 2 (two) times daily.    [provider]    Allergies Crestor [rosuvastatin] and Demerol [meperidine]   REVIEW OF SYSTEMS  Negative except as noted here or in the History of Present Illness.   PHYSICAL EXAMINATION  Initial Vital Signs Blood pressure (!) 147/76, pulse (!) 54, temperature 98.7 F (37.1 C), temperature source Oral, resp. rate 16, height 5\' 6"  (1.676 m), weight 79.4 kg, SpO2 97 %.  Examination General: Well-developed, well-nourished male in no acute distress; appearance consistent with age of record HENT: normocephalic; atraumatic Eyes: pupils equal, round and reactive to light; extraocular muscles intact Neck: supple Heart: regular rate and  rhythm Lungs: clear to auscultation bilaterally Abdomen: soft; nondistended; mild right lower quadrant tenderness; bowel sounds present GU: Mild right CVA tenderness Extremities: No deformity; full range of motion; pulses normal Neurologic: Awake, alert and oriented; motor function intact in all extremities and symmetric; no facial droop Skin: Warm and dry Psychiatric: Grimacing   RESULTS  Summary of this visit's results, reviewed and interpreted by myself:   EKG Interpretation  Date/Time:    Ventricular Rate:    PR Interval:    QRS Duration:   QT Interval:    QTC Calculation:   R Axis:     Text Interpretation:        Laboratory Studies: Results for orders placed or performed during the hospital encounter of 04/18/19 (from the past 24 hour(s))  Lipase, blood     Status: None   Collection Time: 04/18/19 12:37 AM  Result Value Ref Range   Lipase 29 11 - 51 U/L  Comprehensive metabolic panel     Status: Abnormal   Collection Time: 04/18/19 12:37 AM  Result Value Ref Range   Sodium 139 135 - 145 mmol/L   Potassium 4.0 3.5 - 5.1 mmol/L   Chloride 105 98 - 111 mmol/L   CO2 24 22 - 32 mmol/L   Glucose, Bld 135 (H) 70 - 99 mg/dL   BUN 21 8 - 23 mg/dL   Creatinine, Ser 1.39 (H) 0.61 - 1.24 mg/dL   Calcium 9.1 8.9 - 10.3 mg/dL   Total Protein 7.6 6.5 - 8.1 g/dL   Albumin 4.6 3.5 - 5.0 g/dL   AST 34 15 - 41 U/L   ALT 49 (H) 0 - 44 U/L   Alkaline Phosphatase 79 38 - 126 U/L   Total Bilirubin 0.7 0.3 - 1.2 mg/dL   GFR calc non Af Amer 54 (L) >60 mL/min   GFR calc Af Amer >60 >60 mL/min   Anion gap 10 5 - 15  CBC     Status: Abnormal   Collection Time: 04/18/19 12:37 AM  Result Value Ref Range   WBC 14.2 (H) 4.0 - 10.5 K/uL   RBC 5.11 4.22 - 5.81 MIL/uL   Hemoglobin 15.7 13.0 - 17.0 g/dL   HCT 46.4 39.0 - 52.0 %   MCV 90.8 80.0 - 100.0 fL   MCH 30.7 26.0 - 34.0 pg   MCHC 33.8 30.0 - 36.0 g/dL   RDW 12.4 11.5 - 15.5 %   Platelets 231 150 - 400 K/uL   nRBC 0.0 0.0 -  0.2 %  Urinalysis, Routine w reflex microscopic     Status: Abnormal   Collection Time: 04/18/19 12:37 AM  Result Value Ref Range   Color, Urine YELLOW YELLOW   APPearance CLEAR CLEAR   Specific Gravity,  Urine 1.023 1.005 - 1.030   pH 6.0 5.0 - 8.0   Glucose, UA NEGATIVE NEGATIVE mg/dL   Hgb urine dipstick LARGE (A) NEGATIVE   Bilirubin Urine NEGATIVE NEGATIVE   Ketones, ur 20 (A) NEGATIVE mg/dL   Protein, ur 30 (A) NEGATIVE mg/dL   Nitrite NEGATIVE NEGATIVE   Leukocytes,Ua NEGATIVE NEGATIVE   RBC / HPF >50 (H) 0 - 5 RBC/hpf   WBC, UA 0-5 0 - 5 WBC/hpf   Bacteria, UA RARE (A) NONE SEEN   Squamous Epithelial / LPF 0-5 0 - 5   Mucus PRESENT   Basic metabolic panel     Status: Abnormal   Collection Time: 04/18/19 12:37 AM  Result Value Ref Range   Sodium 140 135 - 145 mmol/L   Potassium 4.1 3.5 - 5.1 mmol/L   Chloride 105 98 - 111 mmol/L   CO2 23 22 - 32 mmol/L   Glucose, Bld 132 (H) 70 - 99 mg/dL   BUN 21 8 - 23 mg/dL   Creatinine, Ser 4.48 (H) 0.61 - 1.24 mg/dL   Calcium 9.2 8.9 - 18.5 mg/dL   GFR calc non Af Amer 55 (L) >60 mL/min   GFR calc Af Amer >60 >60 mL/min   Anion gap 12 5 - 15  CBC with Differential     Status: Abnormal   Collection Time: 04/18/19 12:37 AM  Result Value Ref Range   WBC 13.6 (H) 4.0 - 10.5 K/uL   RBC 5.07 4.22 - 5.81 MIL/uL   Hemoglobin 15.3 13.0 - 17.0 g/dL   HCT 63.1 49.7 - 02.6 %   MCV 90.9 80.0 - 100.0 fL   MCH 30.2 26.0 - 34.0 pg   MCHC 33.2 30.0 - 36.0 g/dL   RDW 37.8 58.8 - 50.2 %   Platelets 237 150 - 400 K/uL   nRBC 0.0 0.0 - 0.2 %   Neutrophils Relative % 84 %   Neutro Abs 11.3 (H) 1.7 - 7.7 K/uL   Lymphocytes Relative 7 %   Lymphs Abs 1.0 0.7 - 4.0 K/uL   Monocytes Relative 9 %   Monocytes Absolute 1.2 (H) 0.1 - 1.0 K/uL   Eosinophils Relative 0 %   Eosinophils Absolute 0.0 0.0 - 0.5 K/uL   Basophils Relative 0 %   Basophils Absolute 0.1 0.0 - 0.1 K/uL   Immature Granulocytes 0 %   Abs Immature Granulocytes 0.06 0.00 - 0.07  K/uL   Imaging Studies: CT RENAL STONE STUDY  Result Date: 04/18/2019 CLINICAL DATA:  Right flank pain. History of kidney stones. EXAM: CT ABDOMEN AND PELVIS WITHOUT CONTRAST TECHNIQUE: Multidetector CT imaging of the abdomen and pelvis was performed following the standard protocol without IV contrast. COMPARISON:  CT 01/28/2004 FINDINGS: Lower chest: Linear atelectasis in both lung bases, right greater than left. Hepatobiliary: No focal liver abnormality is seen. No gallstones, gallbladder wall thickening, or biliary dilatation. Pancreas: No ductal dilatation or inflammation. Spleen: Normal in size without focal abnormality. Adrenals/Urinary Tract: Normal adrenal glands. Obstructing 5 x 7 mm stone in the right mid distal ureter (at the level of S1) with moderate proximal hydroureteronephrosis. Moderate right perinephric edema. Additional nonobstructing stones in the right kidney. Nonobstructing stones in the left kidney. There is soft perinephric edema but no hydronephrosis. 3.6 cm lesion in the anterior upper left kidney is not definitely simple cyst, and is increased in size from 2015 CT. The urinary bladder is physiologically distended. No bladder stone or wall thickening. Stomach/Bowel: Tiny hiatal hernia. Stomach  is otherwise unremarkable. No small bowel obstruction or inflammation. Normal appendix. Multifocal colonic diverticulosis without diverticulitis. Vascular/Lymphatic: Aortic atherosclerosis. No aneurysm. No enlarged lymph nodes in the abdomen or pelvis. Reproductive: Large prostate gland spans 5.3 cm. Other: No ascites. No free air. Minimal fat in the left inguinal canal. Musculoskeletal: Transitional lumbosacral anatomy. There are no acute or suspicious osseous abnormalities. IMPRESSION: 1. Obstructing 5 x 7 mm stone in the right mid distal ureter with moderate hydronephrosis and perinephric edema. 2. Additional nonobstructing stones in both kidneys. 3. Indeterminate exophytic lesion from the  upper left kidney measuring 3.6 cm. This is increased in size from 2015 CT. Recommend further characterization with renal protocol MRI on an elective basis. 4. Colonic diverticulosis without diverticulitis. Enlarged prostate gland. Aortic Atherosclerosis (ICD10-I70.0). Electronically Signed   By: Narda RutherfordMelanie  Sanford M.D.   On: 04/18/2019 03:06    ED COURSE and MDM  Nursing notes, initial and subsequent vitals signs, including pulse oximetry, reviewed and interpreted by myself.  Vitals:   04/18/19 0032 04/18/19 0055 04/18/19 0322  BP: (!) 177/112  (!) 147/76  Pulse: (!) 101  (!) 54  Resp: (!) 24  16  Temp: 98.7 F (37.1 C)    TempSrc: Oral    SpO2: 96%  97%  Weight: 79.4 kg 79.4 kg   Height: 5\' 6"  (1.676 m) 5\' 6"  (1.676 m)    Medications  HYDROmorphone (DILAUDID) 1 MG/ML injection (has no administration in time range)  ondansetron (ZOFRAN) injection 4 mg (has no administration in time range)  sodium chloride flush (NS) 0.9 % injection 3 mL (3 mLs Intravenous Given 04/18/19 0325)  ondansetron (ZOFRAN-ODT) disintegrating tablet 4 mg (4 mg Oral Given 04/18/19 0058)  HYDROmorphone (DILAUDID) injection 1 mg (1 mg Intravenous Given 04/18/19 0324)   4:20 AM Pain well controlled at this time.  Patient advised of large stone on the right, likely to need surgical intervention.  He was also advised of enlargement of left kidney lesion.  The patient had been aware of this lesion in the past.  PROCEDURES  Procedures   ED DIAGNOSES     ICD-10-CM   1. Ureteral stone  N20.1   2. Left kidney mass  N28.89        Christopher Gordon, Christopher RuizJohn, MD 04/18/19 0430

## 2019-04-19 ENCOUNTER — Other Ambulatory Visit (HOSPITAL_COMMUNITY)
Admission: RE | Admit: 2019-04-19 | Discharge: 2019-04-19 | Disposition: A | Discharge status: Home / Self Care | Payer: BC Managed Care – PPO | Source: Ambulatory Visit | Attending: Urology | Admitting: Urology

## 2019-04-19 ENCOUNTER — Encounter (HOSPITAL_BASED_OUTPATIENT_CLINIC_OR_DEPARTMENT_OTHER): Payer: Self-pay | Admitting: Urology

## 2019-04-19 DIAGNOSIS — Z01812 Encounter for preprocedural laboratory examination: Secondary | ICD-10-CM | POA: Diagnosis not present

## 2019-04-19 DIAGNOSIS — Z20828 Contact with and (suspected) exposure to other viral communicable diseases: Secondary | ICD-10-CM | POA: Insufficient documentation

## 2019-04-19 DIAGNOSIS — N201 Calculus of ureter: Secondary | ICD-10-CM | POA: Diagnosis not present

## 2019-04-19 LAB — SARS CORONAVIRUS 2 (TAT 6-24 HRS): SARS Coronavirus 2: NEGATIVE

## 2019-04-22 ENCOUNTER — Ambulatory Visit (HOSPITAL_BASED_OUTPATIENT_CLINIC_OR_DEPARTMENT_OTHER)
Admission: RE | Admit: 2019-04-22 | Discharge: 2019-04-22 | Disposition: A | Discharge status: Home / Self Care | Payer: BC Managed Care – PPO | Attending: Urology | Admitting: Urology

## 2019-04-22 ENCOUNTER — Ambulatory Visit (HOSPITAL_COMMUNITY): Payer: BC Managed Care – PPO

## 2019-04-22 ENCOUNTER — Encounter (HOSPITAL_BASED_OUTPATIENT_CLINIC_OR_DEPARTMENT_OTHER)
Admission: RE | Disposition: A | Discharge status: Home / Self Care | Payer: Self-pay | Source: Home / Self Care | Attending: Urology

## 2019-04-22 ENCOUNTER — Encounter (HOSPITAL_BASED_OUTPATIENT_CLINIC_OR_DEPARTMENT_OTHER): Payer: Self-pay | Admitting: Urology

## 2019-04-22 ENCOUNTER — Other Ambulatory Visit: Payer: Self-pay

## 2019-04-22 DIAGNOSIS — N201 Calculus of ureter: Secondary | ICD-10-CM | POA: Insufficient documentation

## 2019-04-22 DIAGNOSIS — I10 Essential (primary) hypertension: Secondary | ICD-10-CM | POA: Insufficient documentation

## 2019-04-22 DIAGNOSIS — N202 Calculus of kidney with calculus of ureter: Secondary | ICD-10-CM | POA: Diagnosis not present

## 2019-04-22 HISTORY — PX: EXTRACORPOREAL SHOCK WAVE LITHOTRIPSY: SHX1557

## 2019-04-22 HISTORY — DX: Cardiac arrhythmia, unspecified: I49.9

## 2019-04-22 SURGERY — LITHOTRIPSY, ESWL
Anesthesia: LOCAL | Laterality: Right

## 2019-04-22 MED ORDER — DIPHENHYDRAMINE HCL 25 MG PO CAPS
25.0000 mg | ORAL_CAPSULE | ORAL | Status: AC
  Administered 2019-04-22: 25 mg via ORAL
  Filled 2019-04-22: qty 1

## 2019-04-22 MED ORDER — DIPHENHYDRAMINE HCL 25 MG PO CAPS
ORAL_CAPSULE | ORAL | Status: AC
  Filled 2019-04-22: qty 1

## 2019-04-22 MED ORDER — DIAZEPAM 5 MG PO TABS
ORAL_TABLET | ORAL | Status: AC
  Filled 2019-04-22: qty 2

## 2019-04-22 MED ORDER — CIPROFLOXACIN HCL 500 MG PO TABS
ORAL_TABLET | ORAL | Status: AC
  Filled 2019-04-22: qty 1

## 2019-04-22 MED ORDER — HYDROMORPHONE HCL 4 MG PO TABS: 2.0000 mg | ORAL_TABLET | ORAL | 0 refills | Status: DC | PRN

## 2019-04-22 MED ORDER — OXYCODONE-ACETAMINOPHEN 5-325 MG PO TABS
1.0000 | ORAL_TABLET | Freq: Once | ORAL | Status: AC
  Administered 2019-04-22: 1 via ORAL
  Filled 2019-04-22: qty 1

## 2019-04-22 MED ORDER — CIPROFLOXACIN HCL 500 MG PO TABS
500.0000 mg | ORAL_TABLET | ORAL | Status: AC
  Administered 2019-04-22: 500 mg via ORAL
  Filled 2019-04-22: qty 1

## 2019-04-22 MED ORDER — DIAZEPAM 5 MG PO TABS
10.0000 mg | ORAL_TABLET | ORAL | Status: AC
  Administered 2019-04-22: 10 mg via ORAL
  Filled 2019-04-22: qty 2

## 2019-04-22 MED ORDER — SODIUM CHLORIDE 0.9 % IV SOLN
INTRAVENOUS | Status: DC
  Administered 2019-04-22: 09:00:00 via INTRAVENOUS
  Filled 2019-04-22: qty 1000

## 2019-04-22 MED ORDER — OXYCODONE-ACETAMINOPHEN 5-325 MG PO TABS
ORAL_TABLET | ORAL | Status: AC
  Filled 2019-04-22: qty 1

## 2019-04-22 NOTE — Op Note (Signed)
See [facility] OP note scanned into chart. Also because of the size, density, location and other factors that cannot be anticipated I feel this will likely be a staged procedure. This fact supersedes any indication in the scanned [facility] operative note to the contrary.  

## 2019-04-22 NOTE — H&P (Signed)
See scanned H&P

## 2019-04-22 NOTE — Discharge Instructions (Signed)
°Post Anesthesia Home Care Instructions ° °Activity: °Get plenty of rest for the remainder of the day. A responsible adult should stay with you for 24 hours following the procedure.  °For the next 24 hours, DO NOT: °-Drive a car °-Operate machinery °-Drink alcoholic beverages °-Take any medication unless instructed by your physician °-Make any legal decisions or sign important papers. ° °Meals: °Start with liquid foods such as gelatin or soup. Progress to regular foods as tolerated. Avoid greasy, spicy, heavy foods. If nausea and/or vomiting occur, drink only clear liquids until the nausea and/or vomiting subsides. Call your physician if vomiting continues. ° °Special Instructions/Symptoms: °Your throat may feel dry or sore from the anesthesia or the breathing tube placed in your throat during surgery. If this causes discomfort, gargle with warm salt water. The discomfort should disappear within 24 hours. ° °If you had a scopolamine patch placed behind your ear for the management of post- operative nausea and/or vomiting: ° °1. The medication in the patch is effective for 72 hours, after which it should be removed.  Wrap patch in a tissue and discard in the trash. Wash hands thoroughly with soap and water. °2. You may remove the patch earlier than 72 hours if you experience unpleasant side effects which may include dry mouth, dizziness or visual disturbances. °3. Avoid touching the patch. Wash your hands with soap and water after contact with the patch. °  °Lithotripsy, Care After °This sheet gives you information about how to care for yourself after your procedure. Your health care provider may also give you more specific instructions. If you have problems or questions, contact your health care provider. °What can I expect after the procedure? °After the procedure, it is common to have: °· Some blood in your urine. This should only last for a few days. °· Soreness in your back, sides, or upper abdomen for a few  days. °· Blotches or bruises on your back where the pressure wave entered the skin. °· Pain, discomfort, or nausea when pieces (fragments) of the kidney stone move through the tube that carries urine from the kidney to the bladder (ureter). Stone fragments may pass soon after the procedure, but they may continue to pass for up to 4-8 weeks. °? If you have severe pain or nausea, contact your health care provider. This may be caused by a large stone that was not broken up, and this may mean that you need more treatment. °· Some pain or discomfort during urination. °· Some pain or discomfort in the lower abdomen or (in men) at the base of the penis. °Follow these instructions at home: °Medicines °· Take over-the-counter and prescription medicines only as told by your health care provider. °· If you were prescribed an antibiotic medicine, take it as told by your health care provider. Do not stop taking the antibiotic even if you start to feel better. °· Do not drive for 24 hours if you were given a medicine to help you relax (sedative). °· Do not drive or use heavy machinery while taking prescription pain medicine. °Eating and drinking ° °  ° °· Drink enough water and fluids to keep your urine clear or pale yellow. This helps any remaining pieces of the stone to pass. It can also help prevent new stones from forming. °· Eat plenty of fresh fruits and vegetables. °· Follow instructions from your health care provider about eating and drinking restrictions. You may be instructed: °? To reduce how much salt (sodium) you eat   or drink. Check ingredients and nutrition facts on packaged foods and beverages. °? To reduce how much meat you eat. °· Eat the recommended amount of calcium for your age and gender. Ask your health care provider how much calcium you should have. °General instructions °· Get plenty of rest. °· Most people can resume normal activities 1-2 days after the procedure. Ask your health care provider what  activities are safe for you. °· Your health care provider may direct you to lie in a certain position (postural drainage) and tap firmly (percuss) over your kidney area to help stone fragments pass. Follow instructions as told by your health care provider. °· If directed, strain all urine through the strainer that was provided by your health care provider. °? Keep all fragments for your health care provider to see. Any stones that are found may be sent to a medical lab for examination. The stone may be as small as a grain of salt. °· Keep all follow-up visits as told by your health care provider. This is important. °Contact a health care provider if: °· You have pain that is severe or does not get better with medicine. °· You have nausea that is severe or does not go away. °· You have blood in your urine longer than your health care provider told you to expect. °· You have more blood in your urine. °· You have pain during urination that does not go away. °· You urinate more frequently than usual and this does not go away. °· You develop a rash or any other possible signs of an allergic reaction. °Get help right away if: °· You have severe pain in your back, sides, or upper abdomen. °· You have severe pain while urinating. °· Your urine is very dark red. °· You have blood in your stool (feces). °· You cannot pass any urine at all. °· You feel a strong urge to urinate after emptying your bladder. °· You have a fever or chills. °· You develop shortness of breath, difficulty breathing, or chest pain. °· You have severe nausea that leads to persistent vomiting. °· You faint. °Summary °· After this procedure, it is common to have some pain, discomfort, or nausea when pieces (fragments) of the kidney stone move through the tube that carries urine from the kidney to the bladder (ureter). If this pain or nausea is severe, however, you should contact your health care provider. °· Most people can resume normal activities 1-2  days after the procedure. Ask your health care provider what activities are safe for you. °· Drink enough water and fluids to keep your urine clear or pale yellow. This helps any remaining pieces of the stone to pass, and it can help prevent new stones from forming. °· If directed, strain your urine and keep all fragments for your health care provider to see. Fragments or stones may be as small as a grain of salt. °· Get help right away if you have severe pain in your back, sides, or upper abdomen or have severe pain while urinating. °This information is not intended to replace advice given to you by your health care provider. Make sure you discuss any questions you have with your health care provider. °Document Released: 05/15/2007 Document Revised: 08/06/2018 Document Reviewed: 03/16/2016 °Elsevier Patient Education © 2020 [employer] ° °

## 2019-04-23 ENCOUNTER — Other Ambulatory Visit: Payer: Self-pay

## 2019-04-23 ENCOUNTER — Emergency Department (HOSPITAL_COMMUNITY)
Admission: EM | Admit: 2019-04-23 | Discharge: 2019-04-23 | Discharge status: Against Medical Advice | Payer: BC Managed Care – PPO | Attending: Emergency Medicine | Admitting: Emergency Medicine

## 2019-04-23 ENCOUNTER — Encounter (HOSPITAL_COMMUNITY): Payer: Self-pay | Admitting: Emergency Medicine

## 2019-04-23 DIAGNOSIS — Z5321 Procedure and treatment not carried out due to patient leaving prior to being seen by health care provider: Secondary | ICD-10-CM | POA: Insufficient documentation

## 2019-04-23 DIAGNOSIS — R109 Unspecified abdominal pain: Secondary | ICD-10-CM | POA: Insufficient documentation

## 2019-04-23 DIAGNOSIS — N200 Calculus of kidney: Secondary | ICD-10-CM | POA: Diagnosis not present

## 2019-04-23 LAB — URINALYSIS, ROUTINE W REFLEX MICROSCOPIC
Bacteria, UA: NONE SEEN
Bilirubin Urine: NEGATIVE
Glucose, UA: NEGATIVE mg/dL
Ketones, ur: NEGATIVE mg/dL
Leukocytes,Ua: NEGATIVE
Nitrite: NEGATIVE
Protein, ur: NEGATIVE mg/dL
RBC / HPF: >50 RBC/hpf — ABNORMAL HIGH (ref 0–5)
Specific Gravity, Urine: 1.015 (ref 1.005–1.030)
pH: 5 (ref 5.0–8.0)

## 2019-04-23 NOTE — ED Triage Notes (Signed)
Patient had lithotripsy today. Patient is complaining of pain on his right side. Patient states he has not passed any of the stone that they busted up.

## 2019-04-24 ENCOUNTER — Encounter (HOSPITAL_BASED_OUTPATIENT_CLINIC_OR_DEPARTMENT_OTHER)
Admission: AD | Disposition: A | Discharge status: Home / Self Care | Payer: Self-pay | Source: Ambulatory Visit | Attending: Urology

## 2019-04-24 ENCOUNTER — Other Ambulatory Visit (HOSPITAL_COMMUNITY)
Admission: RE | Admit: 2019-04-24 | Discharge: 2019-04-24 | Disposition: A | Discharge status: Home / Self Care | Payer: BC Managed Care – PPO | Source: Ambulatory Visit | Attending: Urology | Admitting: Urology

## 2019-04-24 ENCOUNTER — Other Ambulatory Visit: Payer: Self-pay | Admitting: Urology

## 2019-04-24 ENCOUNTER — Other Ambulatory Visit: Payer: Self-pay

## 2019-04-24 ENCOUNTER — Encounter (HOSPITAL_BASED_OUTPATIENT_CLINIC_OR_DEPARTMENT_OTHER): Payer: Self-pay | Admitting: Urology

## 2019-04-24 ENCOUNTER — Observation Stay (HOSPITAL_BASED_OUTPATIENT_CLINIC_OR_DEPARTMENT_OTHER)
Admission: AD | Admit: 2019-04-24 | Discharge: 2019-04-25 | Disposition: A | Discharge status: Home / Self Care | Payer: BC Managed Care – PPO | Source: Ambulatory Visit | Attending: Urology | Admitting: Urology

## 2019-04-24 ENCOUNTER — Ambulatory Visit (HOSPITAL_BASED_OUTPATIENT_CLINIC_OR_DEPARTMENT_OTHER): Payer: BC Managed Care – PPO | Admitting: Anesthesiology

## 2019-04-24 ENCOUNTER — Ambulatory Visit (HOSPITAL_COMMUNITY): Payer: BC Managed Care – PPO

## 2019-04-24 DIAGNOSIS — Z8249 Family history of ischemic heart disease and other diseases of the circulatory system: Secondary | ICD-10-CM | POA: Diagnosis not present

## 2019-04-24 DIAGNOSIS — M1990 Unspecified osteoarthritis, unspecified site: Secondary | ICD-10-CM | POA: Insufficient documentation

## 2019-04-24 DIAGNOSIS — Z833 Family history of diabetes mellitus: Secondary | ICD-10-CM | POA: Diagnosis not present

## 2019-04-24 DIAGNOSIS — R1031 Right lower quadrant pain: Secondary | ICD-10-CM | POA: Diagnosis not present

## 2019-04-24 DIAGNOSIS — E785 Hyperlipidemia, unspecified: Secondary | ICD-10-CM | POA: Insufficient documentation

## 2019-04-24 DIAGNOSIS — N201 Calculus of ureter: Secondary | ICD-10-CM | POA: Diagnosis not present

## 2019-04-24 DIAGNOSIS — Z87891 Personal history of nicotine dependence: Secondary | ICD-10-CM | POA: Insufficient documentation

## 2019-04-24 DIAGNOSIS — Z20828 Contact with and (suspected) exposure to other viral communicable diseases: Secondary | ICD-10-CM | POA: Insufficient documentation

## 2019-04-24 DIAGNOSIS — F988 Other specified behavioral and emotional disorders with onset usually occurring in childhood and adolescence: Secondary | ICD-10-CM | POA: Insufficient documentation

## 2019-04-24 DIAGNOSIS — R509 Fever, unspecified: Secondary | ICD-10-CM | POA: Diagnosis not present

## 2019-04-24 DIAGNOSIS — K219 Gastro-esophageal reflux disease without esophagitis: Secondary | ICD-10-CM | POA: Diagnosis not present

## 2019-04-24 DIAGNOSIS — R8271 Bacteriuria: Secondary | ICD-10-CM | POA: Diagnosis not present

## 2019-04-24 HISTORY — PX: CYSTOSCOPY W/ URETERAL STENT PLACEMENT: SHX1429

## 2019-04-24 LAB — RESPIRATORY PANEL BY RT PCR (FLU A&B, COVID)
Influenza A by PCR: NEGATIVE
Influenza B by PCR: NEGATIVE
SARS Coronavirus 2 by RT PCR: NEGATIVE

## 2019-04-24 SURGERY — CYSTOSCOPY, WITH RETROGRADE PYELOGRAM AND URETERAL STENT INSERTION
Anesthesia: General | Site: Ureter | Laterality: Right

## 2019-04-24 MED ORDER — ONDANSETRON 8 MG PO TBDP
8.0000 mg | ORAL_TABLET | Freq: Three times a day (TID) | ORAL | Status: DC | PRN
  Filled 2019-04-24: qty 1

## 2019-04-24 MED ORDER — MIDAZOLAM HCL 2 MG/2ML IJ SOLN
INTRAMUSCULAR | Status: AC
  Filled 2019-04-24: qty 2

## 2019-04-24 MED ORDER — OXYCODONE HCL 5 MG PO TABS
5.0000 mg | ORAL_TABLET | Freq: Once | ORAL | Status: DC | PRN
  Filled 2019-04-24: qty 1

## 2019-04-24 MED ORDER — SULFAMETHOXAZOLE-TRIMETHOPRIM 800-160 MG PO TABS: 1.0000 | ORAL_TABLET | Freq: Two times a day (BID) | ORAL | 0 refills | Status: DC

## 2019-04-24 MED ORDER — DEXAMETHASONE SODIUM PHOSPHATE 10 MG/ML IJ SOLN
INTRAMUSCULAR | Status: AC
  Filled 2019-04-24: qty 1

## 2019-04-24 MED ORDER — OXYCODONE-ACETAMINOPHEN 5-325 MG PO TABS
ORAL_TABLET | ORAL | Status: AC
  Filled 2019-04-24: qty 1

## 2019-04-24 MED ORDER — SENNA 8.6 MG PO TABS
ORAL_TABLET | ORAL | Status: AC
  Filled 2019-04-24: qty 1

## 2019-04-24 MED ORDER — OXYCODONE HCL 5 MG/5ML PO SOLN
5.0000 mg | Freq: Once | ORAL | Status: DC | PRN
  Filled 2019-04-24: qty 5

## 2019-04-24 MED ORDER — IOHEXOL 300 MG/ML  SOLN
INTRAMUSCULAR | Status: DC | PRN
  Administered 2019-04-24: 7 mL via URETHRAL

## 2019-04-24 MED ORDER — PROPOFOL 10 MG/ML IV BOLUS
INTRAVENOUS | Status: DC | PRN
  Administered 2019-04-24: 100 mg via INTRAVENOUS

## 2019-04-24 MED ORDER — FENTANYL CITRATE (PF) 100 MCG/2ML IJ SOLN
INTRAMUSCULAR | Status: AC
  Filled 2019-04-24: qty 2

## 2019-04-24 MED ORDER — MIDAZOLAM HCL 5 MG/5ML IJ SOLN
INTRAMUSCULAR | Status: DC | PRN
  Administered 2019-04-24: 2 mg via INTRAVENOUS

## 2019-04-24 MED ORDER — ONDANSETRON HCL 4 MG/2ML IJ SOLN
INTRAMUSCULAR | Status: AC
  Filled 2019-04-24: qty 2

## 2019-04-24 MED ORDER — LOSARTAN POTASSIUM 50 MG PO TABS
50.0000 mg | ORAL_TABLET | Freq: Every day | ORAL | Status: DC
  Filled 2019-04-24: qty 1

## 2019-04-24 MED ORDER — SENNA 8.6 MG PO TABS
1.0000 | ORAL_TABLET | Freq: Two times a day (BID) | ORAL | Status: DC
  Administered 2019-04-24: 8.6 mg via ORAL
  Filled 2019-04-24: qty 1

## 2019-04-24 MED ORDER — CEFAZOLIN SODIUM-DEXTROSE 2-4 GM/100ML-% IV SOLN
INTRAVENOUS | Status: AC
  Filled 2019-04-24: qty 100

## 2019-04-24 MED ORDER — ZOLPIDEM TARTRATE 5 MG PO TABS
5.0000 mg | ORAL_TABLET | Freq: Every evening | ORAL | Status: DC | PRN
  Filled 2019-04-24: qty 1

## 2019-04-24 MED ORDER — SODIUM CHLORIDE 0.9 % IR SOLN
Status: DC | PRN
  Administered 2019-04-24: 3000 mL

## 2019-04-24 MED ORDER — OXYCODONE-ACETAMINOPHEN 5-325 MG PO TABS
1.0000 | ORAL_TABLET | ORAL | Status: DC | PRN
  Administered 2019-04-24 – 2019-04-25 (×4): 1 via ORAL
  Filled 2019-04-24: qty 1

## 2019-04-24 MED ORDER — KETOROLAC TROMETHAMINE 30 MG/ML IJ SOLN
INTRAMUSCULAR | Status: AC
  Filled 2019-04-24: qty 1

## 2019-04-24 MED ORDER — PROPOFOL 10 MG/ML IV BOLUS
INTRAVENOUS | Status: AC
  Filled 2019-04-24: qty 20

## 2019-04-24 MED ORDER — ASPIRIN 81 MG PO TBEC
81.0000 mg | DELAYED_RELEASE_TABLET | Freq: Every day | ORAL | Status: DC
  Filled 2019-04-24: qty 1

## 2019-04-24 MED ORDER — FENTANYL CITRATE (PF) 100 MCG/2ML IJ SOLN
INTRAMUSCULAR | Status: DC | PRN
  Administered 2019-04-24 (×2): 50 ug via INTRAVENOUS

## 2019-04-24 MED ORDER — PANTOPRAZOLE SODIUM 40 MG PO TBEC
DELAYED_RELEASE_TABLET | ORAL | Status: AC
  Filled 2019-04-24: qty 1

## 2019-04-24 MED ORDER — LIDOCAINE 2% (20 MG/ML) 5 ML SYRINGE
INTRAMUSCULAR | Status: AC
  Filled 2019-04-24: qty 5

## 2019-04-24 MED ORDER — OXYBUTYNIN CHLORIDE 5 MG PO TABS
5.0000 mg | ORAL_TABLET | Freq: Three times a day (TID) | ORAL | Status: DC | PRN
  Administered 2019-04-25: 07:00:00 5 mg via ORAL
  Filled 2019-04-24: qty 1

## 2019-04-24 MED ORDER — ONDANSETRON HCL 4 MG/2ML IJ SOLN
4.0000 mg | Freq: Four times a day (QID) | INTRAMUSCULAR | Status: DC | PRN
  Filled 2019-04-24: qty 2

## 2019-04-24 MED ORDER — SULFAMETHOXAZOLE-TRIMETHOPRIM 800-160 MG PO TABS
1.0000 | ORAL_TABLET | Freq: Two times a day (BID) | ORAL | Status: DC
  Administered 2019-04-24: 22:00:00 1 via ORAL
  Filled 2019-04-24 (×3): qty 1

## 2019-04-24 MED ORDER — ONDANSETRON HCL 4 MG/2ML IJ SOLN
INTRAMUSCULAR | Status: DC | PRN
  Administered 2019-04-24: 4 mg via INTRAVENOUS

## 2019-04-24 MED ORDER — SODIUM CHLORIDE 0.45 % IV SOLN
INTRAVENOUS | Status: DC
  Filled 2019-04-24 (×2): qty 1000

## 2019-04-24 MED ORDER — LACTATED RINGERS IV SOLN
INTRAVENOUS | Status: DC
  Administered 2019-04-24: 50 mL/h via INTRAVENOUS
  Filled 2019-04-24: qty 1000

## 2019-04-24 MED ORDER — LIDOCAINE 2% (20 MG/ML) 5 ML SYRINGE
INTRAMUSCULAR | Status: DC | PRN
  Administered 2019-04-24: 60 mg via INTRAVENOUS

## 2019-04-24 MED ORDER — CEFAZOLIN SODIUM-DEXTROSE 2-4 GM/100ML-% IV SOLN
2.0000 g | INTRAVENOUS | Status: AC
  Administered 2019-04-24: 2 g via INTRAVENOUS
  Filled 2019-04-24: qty 100

## 2019-04-24 MED ORDER — POTASSIUM CITRATE ER 10 MEQ (1080 MG) PO TBCR
10.0000 meq | EXTENDED_RELEASE_TABLET | Freq: Two times a day (BID) | ORAL | Status: DC
  Administered 2019-04-24: 10 meq via ORAL
  Filled 2019-04-24 (×3): qty 1

## 2019-04-24 MED ORDER — METHYLPHENIDATE HCL 20 MG PO TABS
20.0000 mg | ORAL_TABLET | Freq: Three times a day (TID) | ORAL | Status: DC
  Filled 2019-04-24: qty 1

## 2019-04-24 MED ORDER — KETOROLAC TROMETHAMINE 30 MG/ML IJ SOLN
INTRAMUSCULAR | Status: DC | PRN
  Administered 2019-04-24: 30 mg via INTRAVENOUS

## 2019-04-24 MED ORDER — MELOXICAM 15 MG PO TABS
15.0000 mg | ORAL_TABLET | Freq: Every day | ORAL | Status: DC
  Filled 2019-04-24: qty 1

## 2019-04-24 MED ORDER — FENTANYL CITRATE (PF) 100 MCG/2ML IJ SOLN
25.0000 ug | INTRAMUSCULAR | Status: DC | PRN
  Filled 2019-04-24: qty 1

## 2019-04-24 MED ORDER — PANTOPRAZOLE SODIUM 40 MG PO TBEC
40.0000 mg | DELAYED_RELEASE_TABLET | Freq: Every day | ORAL | Status: DC
  Administered 2019-04-24: 19:00:00 40 mg via ORAL
  Filled 2019-04-24: qty 1

## 2019-04-24 SURGICAL SUPPLY — 20 items
BAG DRAIN URO-CYSTO SKYTR STRL (DRAIN) ×3 IMPLANT
BAG DRN UROCATH (DRAIN) ×1
CATH INTERMIT  6FR 70CM (CATHETERS) ×3 IMPLANT
CLOTH BEACON ORANGE TIMEOUT ST (SAFETY) ×3 IMPLANT
GLOVE BIO SURGEON STRL SZ7 (GLOVE) ×4 IMPLANT
GLOVE BIO SURGEON STRL SZ8 (GLOVE) ×3 IMPLANT
GOWN STRL REUS W/ TWL XL LVL3 (GOWN DISPOSABLE) ×1 IMPLANT
GOWN STRL REUS W/TWL XL LVL3 (GOWN DISPOSABLE) ×6
GUIDEWIRE ANG ZIPWIRE 038X150 (WIRE) ×2 IMPLANT
GUIDEWIRE STR DUAL SENSOR (WIRE) ×2 IMPLANT
IV NS IRRIG 3000ML ARTHROMATIC (IV SOLUTION) ×4 IMPLANT
KIT TURNOVER CYSTO (KITS) ×3 IMPLANT
MANIFOLD NEPTUNE II (INSTRUMENTS) ×2 IMPLANT
NS IRRIG 500ML POUR BTL (IV SOLUTION) ×3 IMPLANT
PACK CYSTO (CUSTOM PROCEDURE TRAY) ×3 IMPLANT
STENT URET 6FRX26 CONTOUR (STENTS) ×2 IMPLANT
SYR 10ML LL (SYRINGE) ×2 IMPLANT
TUBE CONNECTING 12'X1/4 (SUCTIONS) ×1
TUBE CONNECTING 12X1/4 (SUCTIONS) ×1 IMPLANT
TUBING UROLOGY SET (TUBING) ×2 IMPLANT

## 2019-04-24 NOTE — H&P (Signed)
H&P  Chief Complaint: Kidney stone, pain, fever  History of Present Illness:  63 year old male 2 days out from shockwave lithotripsy of a right mid/ distal ureteral stone.  The patient has had intermittent fevers, now up to 101.7 as well as significant pain and non progression of this stone following his lithotripsy.  Because of these above issues, he presents at this time for cystoscopy, retrograde an urgent stenting.  His urinalyses thus far have not been indicative of infection, but with this fever, I have recommended urgent decompression of his right renal unit with stent, to be followed in the future by subsequent stone treatment.  Past Medical History:  Diagnosis Date  . ADD (attention deficit disorder)   . Arthritis   . GERD (gastroesophageal reflux disease)   . History of kidney stones   . Hyperlipidemia   . Irregular heart beat   . Left ureteral calculus   . Nephrolithiasis    right side calculi  . Wears glasses   . Wears partial dentures    UPPER AND LOWER    Past Surgical History:  Procedure Laterality Date  . CYSTOSCOPY WITH STENT PLACEMENT Left 07/31/2014   Procedure: CYSTOSCOPY WITH STENT PLACEMENT;  Surgeon: Irine Seal, MD;  Location: [facility];  Service: Urology;  Laterality: Left;  . EXTRACORPOREAL SHOCK WAVE LITHOTRIPSY Right 04/22/2019   Procedure: EXTRACORPOREAL SHOCK WAVE LITHOTRIPSY (ESWL);  Surgeon: Lucas Mallow, MD;  Location: [facility];  Service: Urology;  Laterality: Right;  . 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 SHOULDER OPEN ROTATOR CUFF REPAIR Left 2011    Home Medications:  Allergies as of 04/24/2019      Reactions   Crestor [rosuvastatin] Other (See Comments)   "body aches"   Demerol [meperidine] Nausea And Vomiting      Medication List    Notice   Cannot display discharge medications because the patient has not yet been admitted.     Allergies:  Allergies  Allergen Reactions  . Crestor  [Rosuvastatin] Other (See Comments)    "body aches"  . Demerol [Meperidine] Nausea And Vomiting    Family History  Problem Relation Age of Onset  . Diabetes Mother   . Hypertension Mother   . Heart disease Mother        several stents  . Hyperlipidemia Mother   . Alzheimer's disease Father   . Diabetes Father   . Hypertension Father   . Heart attack Maternal Grandmother   . Heart attack Paternal Grandmother   . Heart attack Paternal Grandfather   . Diabetes Brother   . Hypertension Brother     Social History:  reports that he quit smoking about 32 years ago. His smoking use included cigarettes. He quit after 9.00 years of use. He quit smokeless tobacco use about 30 years ago.  His smokeless tobacco use included snuff. He reports that he does not drink alcohol or use drugs.  ROS: A complete review of systems was performed.  All systems are negative except for pertinent findings as noted.  Physical Exam:  Vital signs in last 24 hours: There were no vitals taken for this visit. Constitutional:  Alert and oriented, No acute distress Cardiovascular: Regular rate  Respiratory: Normal respiratory effort GI: Abdomen is soft, nontender, nondistended, no abdominal masses. No CVAT.  Genitourinary: Normal male phallus, testes are descended bilaterally and non-tender and without masses, scrotum is normal in appearance without lesions or masses, perineum is normal on inspection.  Lymphatic: No lymphadenopathy Neurologic: Grossly intact, no focal deficits Psychiatric: Normal mood and affect  Laboratory Data:  No results for input(s): WBC, HGB, HCT, PLT in the last 72 hours.  No results for input(s): NA, K, CL, GLUCOSE, BUN, CALCIUM, CREATININE in the last 72 hours.  Invalid input(s): CO3   No results found for this or any previous visit (from the past 24 hour(s)). Recent Results (from the past 240 hour(s))  SARS CORONAVIRUS 2 (TAT 6-24 HRS) Nasopharyngeal Nasopharyngeal Swab      Status: None   Collection Time: 04/19/19  9:07 AM   Specimen: Nasopharyngeal Swab  Result Value Ref Range Status   SARS Coronavirus 2 NEGATIVE NEGATIVE Final    Comment: (NOTE) SARS-CoV-2 target nucleic acids are NOT DETECTED. The SARS-CoV-2 RNA is generally detectable in upper and lower respiratory specimens during the acute phase of infection. Negative results do not preclude SARS-CoV-2 infection, do not rule out co-infections with other pathogens, and should not be used as the sole basis for treatment or other patient management decisions. Negative results must be combined with clinical observations, patient history, and epidemiological information. The expected result is Negative. Fact Sheet for Patients: HairSlick.no Fact Sheet for Healthcare Providers: quierodirigir.com This test is not yet approved or cleared by the [place] FDA and  has been authorized for detection and/or diagnosis of SARS-CoV-2 by FDA under an Emergency Use Authorization (EUA). This EUA will remain  in effect (meaning this test can be used) for the duration of the COVID-19 declaration under Section 56 4(b)(1) of the Act, 21 U.S.C. section 360bbb-3(b)(1), unless the authorization is terminated or revoked sooner. Performed at [facility], [address], [place], [place] [address]     Renal Function: Recent Labs    04/18/19 0037  CREATININE 1.37*  1.39*   Estimated Creatinine Clearance: 54.6 mL/min (A) (by C-G formula based on SCr of 1.39 mg/dL (H)).  Radiologic Imaging: No results found.  Impression/Assessment:    Right mid/ distal ureteral calculus, status post shockwave lithotripsy with incomplete progression, fever, pain  Plan:   cystoscopy, right retrograde, placement of right double-J stent.  I have discussed the procedure with the patient as well as his daughter who attended the visit today.  They understand the urgent need  to have this procedure done as well as risks and complications.

## 2019-04-24 NOTE — H&P (View-Only) (Signed)
H&P  Chief Complaint: Kidney stone, pain, fever  History of Present Illness:  63 year old male 2 days out from shockwave lithotripsy of a right mid/ distal ureteral stone.  The patient has had intermittent fevers, now up to 101.7 as well as significant pain and non progression of this stone following his lithotripsy.  Because of these above issues, he presents at this time for cystoscopy, retrograde an urgent stenting.  His urinalyses thus far have not been indicative of infection, but with this fever, I have recommended urgent decompression of his right renal unit with stent, to be followed in the future by subsequent stone treatment.  Past Medical History:  Diagnosis Date  . ADD (attention deficit disorder)   . Arthritis   . GERD (gastroesophageal reflux disease)   . History of kidney stones   . Hyperlipidemia   . Irregular heart beat   . Left ureteral calculus   . Nephrolithiasis    right side calculi  . Wears glasses   . Wears partial dentures    UPPER AND LOWER    Past Surgical History:  Procedure Laterality Date  . CYSTOSCOPY WITH STENT PLACEMENT Left 07/31/2014   Procedure: CYSTOSCOPY WITH STENT PLACEMENT;  Surgeon: Irine Seal, MD;  Location: [facility];  Service: Urology;  Laterality: Left;  . EXTRACORPOREAL SHOCK WAVE LITHOTRIPSY Right 04/22/2019   Procedure: EXTRACORPOREAL SHOCK WAVE LITHOTRIPSY (ESWL);  Surgeon: Lucas Mallow, MD;  Location: [facility];  Service: Urology;  Laterality: Right;  . 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 SHOULDER OPEN ROTATOR CUFF REPAIR Left 2011    Home Medications:  Allergies as of 04/24/2019      Reactions   Crestor [rosuvastatin] Other (See Comments)   "body aches"   Demerol [meperidine] Nausea And Vomiting      Medication List    Notice   Cannot display discharge medications because the patient has not yet been admitted.     Allergies:  Allergies  Allergen Reactions  . Crestor  [Rosuvastatin] Other (See Comments)    "body aches"  . Demerol [Meperidine] Nausea And Vomiting    Family History  Problem Relation Age of Onset  . Diabetes Mother   . Hypertension Mother   . Heart disease Mother        several stents  . Hyperlipidemia Mother   . Alzheimer's disease Father   . Diabetes Father   . Hypertension Father   . Heart attack Maternal Grandmother   . Heart attack Paternal Grandmother   . Heart attack Paternal Grandfather   . Diabetes Brother   . Hypertension Brother     Social History:  reports that he quit smoking about 32 years ago. His smoking use included cigarettes. He quit after 9.00 years of use. He quit smokeless tobacco use about 30 years ago.  His smokeless tobacco use included snuff. He reports that he does not drink alcohol or use drugs.  ROS: A complete review of systems was performed.  All systems are negative except for pertinent findings as noted.  Physical Exam:  Vital signs in last 24 hours: There were no vitals taken for this visit. Constitutional:  Alert and oriented, No acute distress Cardiovascular: Regular rate  Respiratory: Normal respiratory effort GI: Abdomen is soft, nontender, nondistended, no abdominal masses. No CVAT.  Genitourinary: Normal male phallus, testes are descended bilaterally and non-tender and without masses, scrotum is normal in appearance without lesions or masses, perineum is normal on inspection.  Lymphatic: No lymphadenopathy Neurologic: Grossly intact, no focal deficits Psychiatric: Normal mood and affect  Laboratory Data:  No results for input(s): WBC, HGB, HCT, PLT in the last 72 hours.  No results for input(s): NA, K, CL, GLUCOSE, BUN, CALCIUM, CREATININE in the last 72 hours.  Invalid input(s): CO3   No results found for this or any previous visit (from the past 24 hour(s)). Recent Results (from the past 240 hour(s))  SARS CORONAVIRUS 2 (TAT 6-24 HRS) Nasopharyngeal Nasopharyngeal Swab      Status: None   Collection Time: 04/19/19  9:07 AM   Specimen: Nasopharyngeal Swab  Result Value Ref Range Status   SARS Coronavirus 2 NEGATIVE NEGATIVE Final    Comment: (NOTE) SARS-CoV-2 target nucleic acids are NOT DETECTED. The SARS-CoV-2 RNA is generally detectable in upper and lower respiratory specimens during the acute phase of infection. Negative results do not preclude SARS-CoV-2 infection, do not rule out co-infections with other pathogens, and should not be used as the sole basis for treatment or other patient management decisions. Negative results must be combined with clinical observations, patient history, and epidemiological information. The expected result is Negative. Fact Sheet for Patients: HairSlick.no Fact Sheet for Healthcare Providers: quierodirigir.com This test is not yet approved or cleared by the [place] FDA and  has been authorized for detection and/or diagnosis of SARS-CoV-2 by FDA under an Emergency Use Authorization (EUA). This EUA will remain  in effect (meaning this test can be used) for the duration of the COVID-19 declaration under Section 56 4(b)(1) of the Act, 21 U.S.C. section 360bbb-3(b)(1), unless the authorization is terminated or revoked sooner. Performed at [facility], [address], [place], [place] [address]     Renal Function: Recent Labs    04/18/19 0037  CREATININE 1.37*  1.39*   Estimated Creatinine Clearance: 54.6 mL/min (A) (by C-G formula based on SCr of 1.39 mg/dL (H)).  Radiologic Imaging: No results found.  Impression/Assessment:    Right mid/ distal ureteral calculus, status post shockwave lithotripsy with incomplete progression, fever, pain  Plan:   cystoscopy, right retrograde, placement of right double-J stent.  I have discussed the procedure with the patient as well as his daughter who attended the visit today.  They understand the urgent need  to have this procedure done as well as risks and complications.

## 2019-04-24 NOTE — Anesthesia Procedure Notes (Signed)
Procedure Name: LMA Insertion Date/Time: 04/24/2019 3:34 PM Performed by: Justice Rocher, CRNA Pre-anesthesia Checklist: Patient identified, Emergency Drugs available, Suction available and Patient being monitored Patient Re-evaluated:Patient Re-evaluated prior to induction Oxygen Delivery Method: Circle system utilized Preoxygenation: Pre-oxygenation with 100% oxygen Induction Type: IV induction Ventilation: Mask ventilation without difficulty LMA: LMA inserted LMA Size: 4.0 Number of attempts: 1 Airway Equipment and Method: Bite block Placement Confirmation: positive ETCO2 and breath sounds checked- equal and bilateral Tube secured with: Tape Dental Injury: Teeth and Oropharynx as per pre-operative assessment

## 2019-04-24 NOTE — Transfer of Care (Signed)
Immediate Anesthesia Transfer of Care Note  Patient: Christopher Gordon  Procedure(s) Performed: Procedure(s) (LRB): CYSTOSCOPY WITH RETROGRADE PYELOGRAM/URETERAL STENT PLACEMENT (Right)  Patient Location: PACU  Anesthesia Type: General  Level of Consciousness: awake, sedated, patient cooperative and responds to stimulation  Airway & Oxygen Therapy: Patient Spontanous Breathing and Patient connected to [place] 02 and soft FM  Post-op Assessment: Report given to PACU RN, Post -op Vital signs reviewed and stable and Patient moving all extremities  Post vital signs: Reviewed and stable  Complications: No apparent anesthesia complications

## 2019-04-24 NOTE — Anesthesia Preprocedure Evaluation (Signed)
Anesthesia Evaluation  Patient identified by MRN, date of birth, ID band Patient awake    Reviewed: Allergy & Precautions, H&P , NPO status , Patient's Chart, lab work & pertinent test results  Airway Mallampati: II   Neck ROM: full    Dental   Pulmonary former smoker,    breath sounds clear to auscultation       Cardiovascular negative cardio ROS   Rhythm:regular Rate:Normal     Neuro/Psych PSYCHIATRIC DISORDERS ADD   GI/Hepatic GERD  ,  Endo/Other    Renal/GU Renal diseasestones     Musculoskeletal   Abdominal   Peds  Hematology   Anesthesia Other Findings   Reproductive/Obstetrics                             Anesthesia Physical Anesthesia Plan  ASA: II  Anesthesia Plan: General   Post-op Pain Management:    Induction: Intravenous  PONV Risk Score and Plan: 2 and Ondansetron, Dexamethasone, Midazolam and Treatment may vary due to age or medical condition  Airway Management Planned: LMA  Additional Equipment:   Intra-op Plan:   Post-operative Plan: Extubation in OR  Informed Consent: I have reviewed the patients History and Physical, chart, labs and discussed the procedure including the risks, benefits and alternatives for the proposed anesthesia with the patient or authorized representative who has indicated his/her understanding and acceptance.       Plan Discussed with: CRNA, Anesthesiologist and Surgeon  Anesthesia Plan Comments:         Anesthesia Quick Evaluation

## 2019-04-24 NOTE — Interval H&P Note (Signed)
History and Physical Interval Note:  04/24/2019 2:23 PM  Christopher Gordon  has presented today for surgery, with the diagnosis of RIGHT URETERAL STONE.  The various methods of treatment have been discussed with the patient and family. After consideration of risks, benefits and other options for treatment, the patient has consented to  Procedure(s): CYSTOSCOPY WITH RETROGRADE PYELOGRAM/URETERAL STENT PLACEMENT (Right) as a surgical intervention.  The patient's history has been reviewed, patient examined, no change in status, stable for surgery.  I have reviewed the patient's chart and labs.  Questions were answered to the patient's satisfaction.     Lillette Boxer Oval Moralez

## 2019-04-24 NOTE — Anesthesia Postprocedure Evaluation (Signed)
Anesthesia Post Note  Patient: Christopher Gordon  Procedure(s) Performed: CYSTOSCOPY WITH RETROGRADE PYELOGRAM/URETERAL STENT PLACEMENT (Right Ureter)     Patient location during evaluation: PACU Anesthesia Type: General Level of consciousness: awake and alert, oriented and patient cooperative Pain management: pain level controlled Vital Signs Assessment: post-procedure vital signs reviewed and stable Respiratory status: spontaneous breathing, nonlabored ventilation and respiratory function stable Cardiovascular status: blood pressure returned to baseline and stable Postop Assessment: no apparent nausea or vomiting Anesthetic complications: no    Last Vitals:  Vitals:   04/24/19 1333 04/24/19 1608  BP: 134/86 121/81  Pulse: 88 60  Resp: 16 13  Temp: 36.6 C 36.4 C  SpO2: 99%     Last Pain:  Vitals:   04/24/19 1608  TempSrc:   PainSc: 0-No pain                 Pervis Hocking

## 2019-04-25 DIAGNOSIS — F988 Other specified behavioral and emotional disorders with onset usually occurring in childhood and adolescence: Secondary | ICD-10-CM | POA: Diagnosis not present

## 2019-04-25 DIAGNOSIS — E785 Hyperlipidemia, unspecified: Secondary | ICD-10-CM | POA: Diagnosis not present

## 2019-04-25 DIAGNOSIS — K219 Gastro-esophageal reflux disease without esophagitis: Secondary | ICD-10-CM | POA: Diagnosis not present

## 2019-04-25 DIAGNOSIS — N201 Calculus of ureter: Secondary | ICD-10-CM | POA: Diagnosis not present

## 2019-04-25 DIAGNOSIS — Z20828 Contact with and (suspected) exposure to other viral communicable diseases: Secondary | ICD-10-CM | POA: Diagnosis not present

## 2019-04-25 DIAGNOSIS — Z833 Family history of diabetes mellitus: Secondary | ICD-10-CM | POA: Diagnosis not present

## 2019-04-25 DIAGNOSIS — Z87891 Personal history of nicotine dependence: Secondary | ICD-10-CM | POA: Diagnosis not present

## 2019-04-25 DIAGNOSIS — M1990 Unspecified osteoarthritis, unspecified site: Secondary | ICD-10-CM | POA: Diagnosis not present

## 2019-04-25 DIAGNOSIS — Z8249 Family history of ischemic heart disease and other diseases of the circulatory system: Secondary | ICD-10-CM | POA: Diagnosis not present

## 2019-04-25 LAB — HIV ANTIBODY (ROUTINE TESTING W REFLEX): HIV Screen 4th Generation wRfx: NONREACTIVE

## 2019-04-25 LAB — URINE CULTURE: Culture: NO GROWTH

## 2019-04-25 MED ORDER — OXYBUTYNIN CHLORIDE 5 MG PO TABS: 5.0000 mg | ORAL_TABLET | Freq: Three times a day (TID) | ORAL | 1 refills | Status: DC | PRN

## 2019-04-25 MED ORDER — OXYCODONE-ACETAMINOPHEN 5-325 MG PO TABS
ORAL_TABLET | ORAL | Status: AC
  Filled 2019-04-25: qty 1

## 2019-04-25 MED ORDER — OXYBUTYNIN CHLORIDE 5 MG PO TABS
ORAL_TABLET | ORAL | Status: AC
  Filled 2019-04-25: qty 1

## 2019-04-25 NOTE — Discharge Instructions (Signed)
°  Post Anesthesia Home Care Instructions  Activity: Get plenty of rest for the remainder of the day. A responsible individual must stay with you for 24 hours following the procedure.  For the next 24 hours, DO NOT: -Drive a car -Paediatric nurse -Drink alcoholic beverages -Take any meYou may see some blood in the urine and may have some burning with urination for 48-72 hours. You also may notice that you have to urinate more frequently or urgently after your procedure which is normal.  You should call should you develop an inability urinate, fever > 101, persistent nausea and vomiting that prevents you from eating or drinking to stay hydrated.  If you have a stent, you will likely urinate more frequently and urgently until the stent is removed and you may experience some discomfort/pain in the lower abdomen and flank especially when urinating. You may take pain medication prescribed to you if needed for pain. You may also intermittently have blood in the urine until the stent is removed. If you have a catheter, you will be taught how to take care of the catheter by the nursing staff prior to discharge from the hospital.  You may periodically feel a strong urge to void with the catheter in place.  This is a bladder spasm and most often can occur when having a bowel movement or moving around. It is typically self-limited and usually will stop after a few minutes.  You may use some Vaseline or Neosporin around the tip of the catheter to reduce friction at the tip of the penis. You may also see some blood in the urine.  A very small amount of blood can make the urine look quite red.  As long as the catheter is draining well, there usually is not a problem.  However, if the catheter is not draining well and is bloody, you should call the office [phone]) to notify us.dication unless instructed by your physician -Make any legal decisions or sign important papers.  Meals: Start with liquid foods such  as gelatin or soup. Progress to regular foods as tolerated. Avoid greasy, spicy, heavy foods. If nausea and/or vomiting occur, drink only clear liquids until the nausea and/or vomiting subsides. Call your physician if vomiting continues.  Special Instructions/Symptoms: Your throat may feel dry or sore from the anesthesia or the breathing tube placed in your throat during surgery. If this causes discomfort, gargle with warm salt water. The discomfort should disappear within 24 hours.       1. You may see some blood in the urine and may have some burning with urination for 48-72 hours. You also may notice that you have to urinate more frequently or urgently after your procedure which is normal.  2. You should call should you develop an inability urinate, fever > 101, persistent nausea and vomiting that prevents you from eating or drinking to stay hydrated.  3. If you have a stent, you will likely urinate more frequently and urgently until the stent is removed and you may experience some discomfort/pain in the lower abdomen and flank especially when urinating. You may take pain medication prescribed to you if needed for pain. You may also intermittently have blood in the urine until the stent is removed.

## 2019-04-26 ENCOUNTER — Other Ambulatory Visit: Payer: Self-pay | Admitting: Urology

## 2019-04-29 ENCOUNTER — Encounter (HOSPITAL_BASED_OUTPATIENT_CLINIC_OR_DEPARTMENT_OTHER): Payer: Self-pay | Admitting: Urology

## 2019-04-29 ENCOUNTER — Other Ambulatory Visit: Payer: Self-pay

## 2019-04-29 NOTE — Progress Notes (Signed)
Spoke with: Remo Lipps NPO:  After Midnight, no gum, candy, or mints   Arrival time: 0915 Labs: Istat 8, EKG AM medications: None Pre op orders: Yes Ride home: Velva Harman (wife) [phone]

## 2019-05-01 DIAGNOSIS — D49512 Neoplasm of unspecified behavior of left kidney: Secondary | ICD-10-CM | POA: Diagnosis not present

## 2019-05-01 DIAGNOSIS — N201 Calculus of ureter: Secondary | ICD-10-CM | POA: Diagnosis not present

## 2019-05-01 DIAGNOSIS — N2889 Other specified disorders of kidney and ureter: Secondary | ICD-10-CM | POA: Diagnosis not present

## 2019-05-01 NOTE — Op Note (Signed)
Preoperative diagnosis:  Right ureteral calculi with lack of progression following lithotripsy and fever  Postoperative diagnosis:  Same  Procedure: Cystoscopy, right retrograde ureteral pyelogram, fluoroscopic interpretation, placement of 6 French by 26 cm contour double-J stent without  Tether   Surgeon:  Lowell Makara  Anesthesia:  General  Complications:  None   Specimen: Urine from right ureter for culture  Indications:  63 year old male status post shockwave lithotripsy approximately 1 week ago.  He has had persistent pain as well as recent fever.  Pain has not responded to outpatient management, and with persistent fever it was strongly recommended that the patient have urgent stent placement and decompression with antibiotic management.  He desires to proceed with this.    Findings:  Inspection of the bladder revealed normal urothelium, normal ureteral orifices.  Prostate nonobstructive.  Retrograde study revealed several filling defects in the distal right ureter consistent with stones, mild right hydroureteronephrosis.    Description of procedure:  The patient was properly identified and marked in the holding area.  He received preoperative IV antibiotics.  He was taken to the operating room where general anesthetic was administered.  He was placed in the dorsolithotomy position.  Genitalia and perineum were prepped and draped.  Proper time-out was performed.    21 French pan endoscope advanced into the bladder with the above-mentioned findings.  6 [place] open-ended catheter utilized for retrograde study with Omnipaque with the above-mentioned findings.  Open-ended catheter was advanced into the right ureter past the filling defects and specimen taken for culture.  Following this, guidewire advanced through the catheter into the upper pole calyceal system on the right side.  Open-ended catheter removed, replaced with a 26 cm x 6 French contour stent with tether removed.  After removal of  guidewire, excellent deployment noted fluoroscopically of the stent.  The bladder was drained and the scope removed.  Procedure was then terminated.  The patient was awakened and taken to the PACU in stable condition.  He tolerated the procedure well.

## 2019-05-04 ENCOUNTER — Other Ambulatory Visit (HOSPITAL_COMMUNITY)
Admission: RE | Admit: 2019-05-04 | Discharge: 2019-05-04 | Disposition: A | Discharge status: Home / Self Care | Payer: BC Managed Care – PPO | Source: Ambulatory Visit | Attending: Urology | Admitting: Urology

## 2019-05-04 DIAGNOSIS — Z20828 Contact with and (suspected) exposure to other viral communicable diseases: Secondary | ICD-10-CM | POA: Diagnosis not present

## 2019-05-04 DIAGNOSIS — Z01812 Encounter for preprocedural laboratory examination: Secondary | ICD-10-CM | POA: Diagnosis not present

## 2019-05-04 LAB — SARS CORONAVIRUS 2 (TAT 6-24 HRS): SARS Coronavirus 2: NEGATIVE

## 2019-05-07 ENCOUNTER — Encounter (HOSPITAL_BASED_OUTPATIENT_CLINIC_OR_DEPARTMENT_OTHER): Payer: Self-pay | Admitting: Urology

## 2019-05-07 ENCOUNTER — Other Ambulatory Visit: Payer: Self-pay

## 2019-05-07 ENCOUNTER — Ambulatory Visit (HOSPITAL_BASED_OUTPATIENT_CLINIC_OR_DEPARTMENT_OTHER): Payer: BC Managed Care – PPO | Admitting: Anesthesiology

## 2019-05-07 ENCOUNTER — Ambulatory Visit (HOSPITAL_BASED_OUTPATIENT_CLINIC_OR_DEPARTMENT_OTHER)
Admission: RE | Admit: 2019-05-07 | Discharge: 2019-05-07 | Disposition: A | Discharge status: Home / Self Care | Payer: BC Managed Care – PPO | Attending: Urology | Admitting: Urology

## 2019-05-07 ENCOUNTER — Encounter (HOSPITAL_BASED_OUTPATIENT_CLINIC_OR_DEPARTMENT_OTHER)
Admission: RE | Disposition: A | Discharge status: Home / Self Care | Payer: Self-pay | Source: Home / Self Care | Attending: Urology

## 2019-05-07 DIAGNOSIS — N189 Chronic kidney disease, unspecified: Secondary | ICD-10-CM | POA: Diagnosis not present

## 2019-05-07 DIAGNOSIS — I129 Hypertensive chronic kidney disease with stage 1 through stage 4 chronic kidney disease, or unspecified chronic kidney disease: Secondary | ICD-10-CM | POA: Diagnosis not present

## 2019-05-07 DIAGNOSIS — M1990 Unspecified osteoarthritis, unspecified site: Secondary | ICD-10-CM | POA: Diagnosis not present

## 2019-05-07 DIAGNOSIS — Z885 Allergy status to narcotic agent status: Secondary | ICD-10-CM | POA: Insufficient documentation

## 2019-05-07 DIAGNOSIS — Z87442 Personal history of urinary calculi: Secondary | ICD-10-CM | POA: Diagnosis not present

## 2019-05-07 DIAGNOSIS — N201 Calculus of ureter: Secondary | ICD-10-CM | POA: Diagnosis not present

## 2019-05-07 DIAGNOSIS — Z87891 Personal history of nicotine dependence: Secondary | ICD-10-CM | POA: Insufficient documentation

## 2019-05-07 DIAGNOSIS — N202 Calculus of kidney with calculus of ureter: Secondary | ICD-10-CM | POA: Insufficient documentation

## 2019-05-07 DIAGNOSIS — Z888 Allergy status to other drugs, medicaments and biological substances status: Secondary | ICD-10-CM | POA: Diagnosis not present

## 2019-05-07 DIAGNOSIS — E785 Hyperlipidemia, unspecified: Secondary | ICD-10-CM | POA: Diagnosis not present

## 2019-05-07 HISTORY — DX: Diverticulitis of intestine, part unspecified, without perforation or abscess without bleeding: K57.92

## 2019-05-07 HISTORY — PX: CYSTOSCOPY/RETROGRADE/URETEROSCOPY/STONE EXTRACTION WITH BASKET: SHX5317

## 2019-05-07 HISTORY — DX: Essential (primary) hypertension: I10

## 2019-05-07 HISTORY — DX: Disorder of kidney and ureter, unspecified: N28.9

## 2019-05-07 LAB — POCT I-STAT, CHEM 8
BUN: 24 mg/dL — ABNORMAL HIGH (ref 8–23)
Calcium, Ion: 1.27 mmol/L (ref 1.15–1.40)
Chloride: 106 mmol/L (ref 98–111)
Creatinine, Ser: 1 mg/dL (ref 0.61–1.24)
Glucose, Bld: 94 mg/dL (ref 70–99)
HCT: 44 % (ref 39.0–52.0)
Hemoglobin: 15 g/dL (ref 13.0–17.0)
Potassium: 3.8 mmol/L (ref 3.5–5.1)
Sodium: 143 mmol/L (ref 135–145)
TCO2: 27 mmol/L (ref 22–32)

## 2019-05-07 SURGERY — CYSTOSCOPY, WITH CALCULUS REMOVAL USING BASKET
Anesthesia: General | Site: Ureter | Laterality: Right

## 2019-05-07 MED ORDER — OXYCODONE HCL 5 MG PO TABS
5.0000 mg | ORAL_TABLET | Freq: Once | ORAL | Status: AC | PRN
  Administered 2019-05-07: 5 mg via ORAL
  Filled 2019-05-07: qty 1

## 2019-05-07 MED ORDER — SODIUM CHLORIDE 0.9 % IV SOLN
2.0000 g | INTRAVENOUS | Status: AC
  Administered 2019-05-07: 2 g via INTRAVENOUS
  Filled 2019-05-07: qty 20

## 2019-05-07 MED ORDER — SODIUM CHLORIDE 0.9 % IR SOLN
Status: DC | PRN
  Administered 2019-05-07: 3000 mL

## 2019-05-07 MED ORDER — DEXAMETHASONE SODIUM PHOSPHATE 10 MG/ML IJ SOLN
INTRAMUSCULAR | Status: DC | PRN
  Administered 2019-05-07: 5 mg via INTRAVENOUS

## 2019-05-07 MED ORDER — MIDAZOLAM HCL 2 MG/2ML IJ SOLN
INTRAMUSCULAR | Status: DC | PRN
  Administered 2019-05-07: 2 mg via INTRAVENOUS

## 2019-05-07 MED ORDER — IOHEXOL 300 MG/ML  SOLN
INTRAMUSCULAR | Status: DC | PRN
  Administered 2019-05-07: 10 mL

## 2019-05-07 MED ORDER — SODIUM CHLORIDE 0.9% FLUSH
3.0000 mL | INTRAVENOUS | Status: DC | PRN
  Filled 2019-05-07: qty 3

## 2019-05-07 MED ORDER — FENTANYL CITRATE (PF) 100 MCG/2ML IJ SOLN
INTRAMUSCULAR | Status: DC | PRN
  Administered 2019-05-07: 50 ug via INTRAVENOUS

## 2019-05-07 MED ORDER — LIDOCAINE 2% (20 MG/ML) 5 ML SYRINGE
INTRAMUSCULAR | Status: DC | PRN
  Administered 2019-05-07: 60 mg via INTRAVENOUS

## 2019-05-07 MED ORDER — ONDANSETRON HCL 4 MG/2ML IJ SOLN
INTRAMUSCULAR | Status: AC
  Filled 2019-05-07: qty 2

## 2019-05-07 MED ORDER — ONDANSETRON HCL 4 MG/2ML IJ SOLN
INTRAMUSCULAR | Status: DC | PRN
  Administered 2019-05-07: 4 mg via INTRAVENOUS

## 2019-05-07 MED ORDER — HYDROMORPHONE HCL 1 MG/ML IJ SOLN
0.2500 mg | INTRAMUSCULAR | Status: DC | PRN
  Filled 2019-05-07: qty 0.5

## 2019-05-07 MED ORDER — SODIUM CHLORIDE 0.9 % IV SOLN
250.0000 mL | INTRAVENOUS | Status: DC | PRN
  Filled 2019-05-07: qty 250

## 2019-05-07 MED ORDER — ACETAMINOPHEN 325 MG PO TABS
650.0000 mg | ORAL_TABLET | ORAL | Status: DC | PRN
  Filled 2019-05-07: qty 2

## 2019-05-07 MED ORDER — SODIUM CHLORIDE 0.9 % IV SOLN
INTRAVENOUS | Status: DC
  Filled 2019-05-07 (×2): qty 1000

## 2019-05-07 MED ORDER — KETOROLAC TROMETHAMINE 30 MG/ML IJ SOLN
INTRAMUSCULAR | Status: AC
  Filled 2019-05-07: qty 1

## 2019-05-07 MED ORDER — SODIUM CHLORIDE 0.9% FLUSH
3.0000 mL | Freq: Two times a day (BID) | INTRAVENOUS | Status: DC
  Filled 2019-05-07: qty 3

## 2019-05-07 MED ORDER — MORPHINE SULFATE (PF) 2 MG/ML IV SOLN
2.0000 mg | INTRAVENOUS | Status: DC | PRN
  Filled 2019-05-07: qty 1

## 2019-05-07 MED ORDER — LIDOCAINE 2% (20 MG/ML) 5 ML SYRINGE
INTRAMUSCULAR | Status: AC
  Filled 2019-05-07: qty 5

## 2019-05-07 MED ORDER — CEFTRIAXONE SODIUM 2 G IJ SOLR
INTRAMUSCULAR | Status: AC
  Filled 2019-05-07: qty 20

## 2019-05-07 MED ORDER — DEXAMETHASONE SODIUM PHOSPHATE 10 MG/ML IJ SOLN
INTRAMUSCULAR | Status: AC
  Filled 2019-05-07: qty 1

## 2019-05-07 MED ORDER — PROPOFOL 10 MG/ML IV BOLUS
INTRAVENOUS | Status: DC | PRN
  Administered 2019-05-07: 150 mg via INTRAVENOUS
  Administered 2019-05-07 (×2): 50 mg via INTRAVENOUS

## 2019-05-07 MED ORDER — KETOROLAC TROMETHAMINE 30 MG/ML IJ SOLN
INTRAMUSCULAR | Status: DC | PRN
  Administered 2019-05-07: 30 mg via INTRAVENOUS

## 2019-05-07 MED ORDER — FENTANYL CITRATE (PF) 100 MCG/2ML IJ SOLN
INTRAMUSCULAR | Status: AC
  Filled 2019-05-07: qty 2

## 2019-05-07 MED ORDER — OXYCODONE HCL 5 MG PO TABS
ORAL_TABLET | ORAL | Status: AC
  Filled 2019-05-07: qty 1

## 2019-05-07 MED ORDER — ACETAMINOPHEN 650 MG RE SUPP
650.0000 mg | RECTAL | Status: DC | PRN
  Filled 2019-05-07: qty 1

## 2019-05-07 MED ORDER — HYDROMORPHONE HCL 4 MG PO TABS: 2.0000 mg | ORAL_TABLET | ORAL | 0 refills | Status: DC | PRN

## 2019-05-07 MED ORDER — OXYCODONE HCL 5 MG/5ML PO SOLN
5.0000 mg | Freq: Once | ORAL | Status: AC | PRN
  Filled 2019-05-07: qty 5

## 2019-05-07 MED ORDER — MIDAZOLAM HCL 2 MG/2ML IJ SOLN
INTRAMUSCULAR | Status: AC
  Filled 2019-05-07: qty 2

## 2019-05-07 MED ORDER — SODIUM CHLORIDE 0.9 % IV SOLN
INTRAVENOUS | Status: AC
  Filled 2019-05-07: qty 100

## 2019-05-07 MED ORDER — PROMETHAZINE HCL 25 MG/ML IJ SOLN
6.2500 mg | INTRAMUSCULAR | Status: DC | PRN
  Filled 2019-05-07: qty 1

## 2019-05-07 MED ORDER — ACETAMINOPHEN 500 MG PO TABS
1000.0000 mg | ORAL_TABLET | Freq: Once | ORAL | Status: AC
  Administered 2019-05-07: 10:00:00 1000 mg via ORAL
  Filled 2019-05-07: qty 2

## 2019-05-07 MED ORDER — ACETAMINOPHEN 500 MG PO TABS
ORAL_TABLET | ORAL | Status: AC
  Filled 2019-05-07: qty 1

## 2019-05-07 MED ORDER — OXYCODONE HCL 5 MG PO TABS
5.0000 mg | ORAL_TABLET | ORAL | Status: DC | PRN
  Filled 2019-05-07: qty 2

## 2019-05-07 SURGICAL SUPPLY — 27 items
BAG DRAIN URO-CYSTO SKYTR STRL (DRAIN) ×4 IMPLANT
BAG DRN UROCATH (DRAIN) ×2
BASKET STONE 1.7 NGAGE (UROLOGICAL SUPPLIES) IMPLANT
BASKET ZERO TIP NITINOL 2.4FR (BASKET) IMPLANT
BSKT STON RTRVL ZERO TP 2.4FR (BASKET)
CATH URET 5FR 28IN CONE TIP (BALLOONS)
CATH URET 5FR 28IN OPEN ENDED (CATHETERS) ×3 IMPLANT
CATH URET 5FR 70CM CONE TIP (BALLOONS) IMPLANT
CLOTH BEACON ORANGE TIMEOUT ST (SAFETY) ×4 IMPLANT
ELECT REM PT RETURN 9FT ADLT (ELECTROSURGICAL)
ELECTRODE REM PT RTRN 9FT ADLT (ELECTROSURGICAL) IMPLANT
FIBER LASER FLEXIVA 365 (UROLOGICAL SUPPLIES) IMPLANT
FIBER LASER TRAC TIP (UROLOGICAL SUPPLIES) IMPLANT
GLOVE SURG SS PI 8.0 STRL IVOR (GLOVE) ×4 IMPLANT
GOWN STRL REUS W/TWL XL LVL3 (GOWN DISPOSABLE) ×4 IMPLANT
GUIDEWIRE ANG ZIPWIRE 038X150 (WIRE) IMPLANT
GUIDEWIRE STR DUAL SENSOR (WIRE) ×4 IMPLANT
IV NS IRRIG 3000ML ARTHROMATIC (IV SOLUTION) ×4 IMPLANT
KIT TURNOVER CYSTO (KITS) ×4 IMPLANT
MANIFOLD NEPTUNE II (INSTRUMENTS) ×4 IMPLANT
NS IRRIG 500ML POUR BTL (IV SOLUTION) ×4 IMPLANT
PACK CYSTO (CUSTOM PROCEDURE TRAY) ×4 IMPLANT
SHEATH URETERAL 12FRX35CM (MISCELLANEOUS) ×3 IMPLANT
STENT URET 6FRX24 CONTOUR (STENTS) ×3 IMPLANT
TUBE CONNECTING 12'X1/4 (SUCTIONS) ×1
TUBE CONNECTING 12X1/4 (SUCTIONS) ×3 IMPLANT
TUBING UROLOGY SET (TUBING) ×3 IMPLANT

## 2019-05-07 NOTE — Anesthesia Postprocedure Evaluation (Signed)
Anesthesia Post Note  Patient: JEROMEY KRUER  Procedure(s) Performed: CYSTOSCOPY/URETEROSCOPY/BASKET EXTRACTION/STENT EXCHANGE (Right Ureter)     Patient location during evaluation: PACU Anesthesia Type: General Level of consciousness: awake and alert, oriented and patient cooperative Pain management: pain level controlled Vital Signs Assessment: post-procedure vital signs reviewed and stable Respiratory status: spontaneous breathing, nonlabored ventilation and respiratory function stable Cardiovascular status: blood pressure returned to baseline and stable Postop Assessment: no apparent nausea or vomiting Anesthetic complications: no    Last Vitals:  Vitals:   05/07/19 0925 05/07/19 1215  BP: 121/77 130/74  Pulse: (!) 51 74  Resp: 16 12  Temp: 36.4 C 36.8 C  SpO2: 100% 97%    Last Pain:  Vitals:   05/07/19 1215  TempSrc:   PainSc: 0-No pain                 Pervis Hocking

## 2019-05-07 NOTE — Anesthesia Procedure Notes (Signed)
Procedure Name: LMA Insertion Date/Time: 05/07/2019 11:25 AM Performed by: Wanita Chamberlain, CRNA Pre-anesthesia Checklist: Patient identified, Emergency Drugs available, Suction available, Patient being monitored and Timeout performed Patient Re-evaluated:Patient Re-evaluated prior to induction Oxygen Delivery Method: Circle system utilized Preoxygenation: Pre-oxygenation with 100% oxygen Induction Type: IV induction Ventilation: Mask ventilation without difficulty LMA: LMA inserted LMA Size: 4.0 Number of attempts: 1 Airway Equipment and Method: Bite block Placement Confirmation: breath sounds checked- equal and bilateral,  CO2 detector and positive ETCO2 Tube secured with: Tape Dental Injury: Teeth and Oropharynx as per pre-operative assessment

## 2019-05-07 NOTE — Interval H&P Note (Signed)
History and Physical Interval Note:  I discussed his CT which showed the stone adjacent to the proximal stent.  He has a benign left renal cyst and a small 94mm right hepatic hemangioma.   05/07/2019 11:13 AM  Christopher Gordon  has presented today for surgery, with the diagnosis of right ureteral stone.  The various methods of treatment have been discussed with the patient and family. After consideration of risks, benefits and other options for treatment, the patient has consented to  Procedure(s): CYSTOSCOPY/URETEROSCOPY/HOLMIUM LASER/STENT EXCHANGE (Right) as a surgical intervention.  The patient's history has been reviewed, patient examined, no change in status, stable for surgery.  I have reviewed the patient's chart and labs.  Questions were answered to the patient's satisfaction.     Irine Seal

## 2019-05-07 NOTE — Discharge Instructions (Signed)
Ureteral Stent Implantation, Care After This sheet gives you information about how to care for yourself after your procedure. Your health care provider may also give you more specific instructions. If you have problems or questions, contact your health care provider. What can I expect after the procedure? After the procedure, it is common to have:  Nausea.  Mild pain when you urinate. You may feel this pain in your lower back or lower abdomen. The pain should stop within a few minutes after you urinate. This may last for up to 1 week.  A small amount of blood in your urine for several days. Follow these instructions at home: Medicines  Take over-the-counter and prescription medicines only as told by your health care provider.  If you were prescribed an antibiotic medicine, take it as told by your health care provider. Do not stop taking the antibiotic even if you start to feel better.  Do not drive for 24 hours if you were given a sedative during your procedure.  Ask your health care provider if the medicine prescribed to you requires you to avoid driving or using heavy machinery. Activity  Rest as told by your health care provider.  Avoid sitting for a long time without moving. Get up to take short walks every 1-2 hours. This is important to improve blood flow and breathing. Ask for help if you feel weak or unsteady.  Return to your normal activities as told by your health care provider. Ask your health care provider what activities are safe for you. General instructions   Watch for any blood in your urine. Call your health care provider if the amount of blood in your urine increases.  If you have a catheter: ? Follow instructions from your health care provider about taking care of your catheter and collection bag. ? Do not take baths, swim, or use a hot tub until your health care provider approves. Ask your health care provider if you may take showers. You may only be allowed to  take sponge baths.  Drink enough fluid to keep your urine pale yellow.  Do not use any products that contain nicotine or tobacco, such as cigarettes, e-cigarettes, and chewing tobacco. These can delay healing after surgery. If you need help quitting, ask your health care provider.  Keep all follow-up visits as told by your health care provider. This is important. Contact a health care provider if:  You have pain that gets worse or does not get better with medicine, especially pain when you urinate.  You have difficulty urinating.  You feel nauseous or you vomit repeatedly during a period of more than 2 days after the procedure. Get help right away if:  Your urine is dark red or has blood clots in it.  You are leaking urine (have incontinence).  The end of the stent comes out of your urethra.  You cannot urinate.  You have sudden, sharp, or severe pain in your abdomen or lower back.  You have a fever.  You have swelling or pain in your legs.  You have difficulty breathing. Summary  After the procedure, it is common to have mild pain when you urinate that goes away within a few minutes after you urinate. This may last for up to 1 week.  Watch for any blood in your urine. Call your health care provider if the amount of blood in your urine increases.  Take over-the-counter and prescription medicines only as told by your health care provider.  Drink   enough fluid to keep your urine pale yellow.  You may remove the ureteral stent on Friday morning.    This information is not intended to replace advice given to you by your health care provider. Make sure you discuss any questions you have with your health care provider. Document Released: 12/26/2012 Document Revised: 01/30/2018 Document Reviewed: 01/31/2018 Elsevier Patient Education  [place], ALEVE, MOTRIN, IBUPROFEN UNTIL 6 PM TONIGHT   Post Anesthesia Home Care Instructions  Activity: Get  plenty of rest for the remainder of the day. A responsible adult should stay with you for 24 hours following the procedure.  For the next 24 hours, DO NOT: -Drive a car -Paediatric nurse -Drink alcoholic beverages -Take any medication unless instructed by your physician -Make any legal decisions or sign important papers.  Meals: Start with liquid foods such as gelatin or soup. Progress to regular foods as tolerated. Avoid greasy, spicy, heavy foods. If nausea and/or vomiting occur, drink only clear liquids until the nausea and/or vomiting subsides. Call your physician if vomiting continues.  Special Instructions/Symptoms: Your throat may feel dry or sore from the anesthesia or the breathing tube placed in your throat during surgery. If this causes discomfort, gargle with warm salt water. The discomfort should disappear within 24 hours.  If you had a scopolamine patch placed behind your ear for the management of post- operative nausea and/or vomiting:  1. The medication in the patch is effective for 72 hours, after which it should be removed.  Wrap patch in a tissue and discard in the trash. Wash hands thoroughly with soap and water. 2. You may remove the patch earlier than 72 hours if you experience unpleasant side effects which may include dry mouth, dizziness or visual disturbances. 3. Avoid touching the patch. Wash your hands with soap and water after contact with the patch.

## 2019-05-07 NOTE — Anesthesia Preprocedure Evaluation (Addendum)
Anesthesia Evaluation  Patient identified by MRN, date of birth, ID band Patient awake    Reviewed: Allergy & Precautions, H&P , NPO status , Patient's Chart, lab work & pertinent test results  Airway Mallampati: II   Neck ROM: full    Dental no notable dental hx. (+) Dental Advisory Given, Poor Dentition, Chipped, Missing,    Pulmonary former smoker,  Former smoker, quit 1986/ quit smokeless tobacco 1990   breath sounds clear to auscultation       Cardiovascular hypertension, Pt. on medications  Rhythm:regular Rate:Normal     Neuro/Psych PSYCHIATRIC DISORDERS ADDnegative neurological ROS     GI/Hepatic GERD  Medicated and Controlled,Hx diverticulosis    Endo/Other  negative endocrine ROS  Renal/GU CRFRenal diseaseRight ureteral stone, Cr 1.4  negative genitourinary   Musculoskeletal  (+) Arthritis , Osteoarthritis,    Abdominal Normal abdominal exam  (+)   Peds  Hematology negative hematology ROS (+)   Anesthesia Other Findings HLD   Reproductive/Obstetrics negative OB ROS                          Anesthesia Physical  Anesthesia Plan  ASA: II  Anesthesia Plan: General   Post-op Pain Management:    Induction: Intravenous  PONV Risk Score and Plan: 3 and Ondansetron, Dexamethasone, Midazolam and Treatment may vary due to age or medical condition  Airway Management Planned: LMA  Additional Equipment: None  Intra-op Plan:   Post-operative Plan: Extubation in OR  Informed Consent: I have reviewed the patients History and Physical, chart, labs and discussed the procedure including the risks, benefits and alternatives for the proposed anesthesia with the patient or authorized representative who has indicated his/her understanding and acceptance.     Dental advisory given  Plan Discussed with: CRNA  Anesthesia Plan Comments:         Anesthesia Quick Evaluation

## 2019-05-07 NOTE — Transfer of Care (Signed)
Immediate Anesthesia Transfer of Care Note  Patient: Christopher Gordon  Procedure(s) Performed: CYSTOSCOPY/URETEROSCOPY/BASKET EXTRACTION/STENT EXCHANGE (Right Ureter)  Patient Location: PACU  Anesthesia Type:General  Level of Consciousness: awake, alert , oriented and patient cooperative  Airway & Oxygen Therapy: Patient Spontanous Breathing and Patient connected to nasal cannula oxygen  Post-op Assessment: Report given to RN and Post -op Vital signs reviewed and stable  Post vital signs: Reviewed and stable  Last Vitals:  Vitals Value Taken Time  BP    Temp    Pulse    Resp    SpO2      Last Pain:  Vitals:   05/07/19 0925  TempSrc: Oral         Complications: No apparent anesthesia complications

## 2019-05-07 NOTE — Op Note (Signed)
Procedure: 1.  Cystoscopy with removal of right double-J stent. 2.  Right ureteroscopic stone extraction. 3.  Cystoscopy with placement of right double-J stent. 4.  Application of fluoroscopy.  Preop diagnosis: Right ureteral stones.  Postop diagnosis: Right ureteral and renal stones.  Surgeon: Dr. Irine Seal.  Anesthesia: General.  Specimen: Stone fragments.  Drain: 6 [place] by 24 cm contour double-J stent on the right with tether.  EBL: None.  Complications: None.  Indications: The patient is a 63 year old male who had lithotripsy for right ureteral stone on 04/22/2019.  He presented to the emergency room on 04/24/2019 with fever and obstructing fragments and had a stent placed.  He returns now for ureteroscopic stone extraction.  Procedure: He was given 2 g of Rocephin.  He was taken operating room where general anesthetic was induced.  He was placed in lithotomy position and fitted with PAS hose.  His perineum and genitalia were prepped with Betadine solution he was draped in usual sterile fashion.  Cystoscopy was performed using a 23 [place] scope and 30 degree lens.  Examination revealed a normal urethra.  The external sphincter was intact.  The prostatic urethra short with bilobar hyperplasia but minimal obstruction.  Examination of bladder revealed mild trabeculation.  The left ureteral orifice was unremarkable.  There was a stent at the right ureteral orifice with mild perimeatal edema.  There was a small stone fragment adjacent to the stent.  The stone fragment was evacuated from the bladder and then the stent was grasped with grasping forceps and pulled the urethral meatus.  A sensor guidewire was passed to the kidney under fluoroscopic guidance through the stent and the stent was removed.  A 12/55fr 35 cm digital access sheath was then passed over the wire into the mid ureter where there was some resistance.  The inner core and wire were removed.  The single-lumen digital  flexible ureteroscope was then passed through the sheath and stone fragments were identified just above the tip of the sheath.  The stones were grasped with an engage basket and removed intact.  Further inspection more proximally demonstrated additional fragments were removed which were removed from the ureter and finally a single fragment was identified in the lower pole of the kidney.  Complete inspection of the intrarenal collecting system was performed.  There was some Randall's plaques but no other free stones.  The ureteroscope was removed while visually inspecting the ureter and no additional fragments were noted but there was some bleeding from where the stones originally been impacted.  In the distal ureter the guidewire was then replaced through the ureteroscope and ureteroscope was backed out further once again no additional stone fragments were identified.  Once ureteroscope and then removed along with the sheath the cystoscope was reinserted over the wire and a 6 [place] by 24 cm contour double-J stent with tether was passed to the kidney under fluoroscopic guidance.  The wire was removed, leaving a good coil in the kidney and a good coil in the bladder.  The bladder was then drained and the cystoscope was removed leaving the stent string exiting the urethra.  Patient was taken out of lithotomy position, his anesthetic was reversed and he was moved recovery in stable condition.  There were no complications.  He will be given a stone fragments to bring to the office for analysis.

## 2019-05-13 NOTE — Discharge Summary (Signed)
Patient ID: Christopher Gordon MRN: [id number] DOB/AGE: 11/07/1955 64 y.o.  Admit date: 04/24/2019 Discharge date: 05/13/2019  Primary Care Physician:  Sigmund Hazel, MD  Discharge Diagnoses:  N20.1 Present on Admission: . Ureter, calculus    Discharge Medications: Allergies as of 04/25/2019      Reactions   Crestor [rosuvastatin] Other (See Comments)   "body aches"   Demerol [meperidine] Nausea And Vomiting      Medication List    TAKE these medications   aspirin EC 81 MG tablet Take 81 mg by mouth daily. Not currently taking   atorvastatin 40 MG tablet Commonly known as: LIPITOR TAKE 1 TABLET DAILY (PLEASE CALL OFFICE AND SCHEDULE OVERDUE YEARLY APPOINTMENT FOR FURTHER REFILLS. SECOND ATTEMPT)   losartan 50 MG tablet Commonly known as: COZAAR Take 50 mg by mouth daily.   meloxicam 15 MG tablet Commonly known as: MOBIC Take 15 mg by mouth daily.   methylphenidate 20 MG tablet Commonly known as: RITALIN Take 20 mg by mouth 3 (three) times daily with meals.   MULTIVITAMIN ADULTS 50+ PO Take 1 tablet by mouth daily.   omeprazole 20 MG capsule Commonly known as: PRILOSEC Take 20 mg by mouth 3 times/day as needed-between meals & bedtime (HEARTBURN).   ondansetron 8 MG disintegrating tablet Commonly known as: Zofran ODT Take 1 tablet (8 mg total) by mouth every 8 (eight) hours as needed for nausea or vomiting.   oxybutynin 5 MG tablet Commonly known as: DITROPAN Take 1 tablet (5 mg total) by mouth every 8 (eight) hours as needed for up to 15 doses for bladder spasms. Notes to patient: TOOK A DOSE AT 0630, CAN TAKE NEXT DOSE AT 230PM   oxyCODONE-acetaminophen 5-325 MG tablet Commonly known as: PERCOCET/ROXICET Take 1 tablet by mouth every 4 (four) hours as needed for severe pain. Notes to patient: TOOK DOSE AT 0630, CAN TAKE NEXT DOSE AT 1030AM   potassium citrate 10 MEQ (1080 MG) SR tablet Commonly known as: UROCIT-K Take 10 mEq by mouth 2 (two) times daily.    sulfamethoxazole-trimethoprim 800-160 MG tablet Commonly known as: BACTRIM DS Take 1 tablet by mouth 2 (two) times daily. Notes to patient: TOOK DOSE LAST NIGHT AT 2230. TAKE NEXT DOSE THIS MORNING AROUND 10AM        Significant Diagnostic Studies:  No results found.  Brief H and P: For complete details please refer to admission H and P, but in brief pt was admitted for mgmt of non-progressing rt ureteral stones post ESL as well as fever.  Hospital Course: He tolerated the stent placememt well, remained afebrile postop and was d/ce'd on POD 1 Active Problems:   Ureter, calculus   Day of Discharge BP (!) 147/80 (BP Location: Left Arm)   Pulse 93   Temp 98.4 F (36.9 C)   Resp 18   Ht 5\' 6"  (1.676 m)   Wt 82.1 kg   SpO2 93%   BMI 29.23 kg/m   No results found for this or any previous visit (from the past 24 hour(s)).  Physical Exam: General: Alert and awake oriented x3 not in any acute distress. HEENT: anicteric sclera, pupils reactive to light and accommodation CVS: S1-S2 clear no murmur rubs or gallops Chest: clear to auscultation bilaterally, no wheezing rales or rhonchi Abdomen: soft nontender, nondistended, normal bowel sounds, no organomegaly Extremities: no cyanosis, clubbing or edema noted bilaterally Neuro: Cranial nerves II-XII intact, no focal neurological deficits  Disposition:  Home  Diet:  Regular  Activity:  unrestricted   Disposition and Follow-up:  Discharge Instructions    Diet general   Complete by: As directed    Increase activity slowly   Complete by: As directed        followup arranged   DISCHARGE FOLLOW-UP  Follow-up Information    Irine Seal, MD Follow up.   Specialty: Urology Why: we will call you to set up appt Contact information: [address] [place] [address] [phone]           Time spent on Discharge:   10 mins  Signed: Lillette Boxer Shankar Silber 05/13/2019, 9:14 AM

## 2019-05-15 DIAGNOSIS — N200 Calculus of kidney: Secondary | ICD-10-CM | POA: Diagnosis not present

## 2019-05-27 DIAGNOSIS — N200 Calculus of kidney: Secondary | ICD-10-CM | POA: Diagnosis not present

## 2019-05-29 DIAGNOSIS — F900 Attention-deficit hyperactivity disorder, predominantly inattentive type: Secondary | ICD-10-CM | POA: Diagnosis not present

## 2019-05-29 DIAGNOSIS — R001 Bradycardia, unspecified: Secondary | ICD-10-CM | POA: Diagnosis not present

## 2019-05-29 DIAGNOSIS — E7800 Pure hypercholesterolemia, unspecified: Secondary | ICD-10-CM | POA: Diagnosis not present

## 2019-05-29 DIAGNOSIS — F411 Generalized anxiety disorder: Secondary | ICD-10-CM | POA: Diagnosis not present

## 2019-07-03 DIAGNOSIS — N200 Calculus of kidney: Secondary | ICD-10-CM | POA: Diagnosis not present

## 2019-07-03 DIAGNOSIS — N281 Cyst of kidney, acquired: Secondary | ICD-10-CM | POA: Diagnosis not present

## 2019-07-09 DIAGNOSIS — N200 Calculus of kidney: Secondary | ICD-10-CM | POA: Diagnosis not present

## 2019-08-16 DIAGNOSIS — N200 Calculus of kidney: Secondary | ICD-10-CM | POA: Diagnosis not present

## 2019-11-27 DIAGNOSIS — E7800 Pure hypercholesterolemia, unspecified: Secondary | ICD-10-CM | POA: Diagnosis not present

## 2019-11-27 DIAGNOSIS — K219 Gastro-esophageal reflux disease without esophagitis: Secondary | ICD-10-CM | POA: Diagnosis not present

## 2019-11-27 DIAGNOSIS — Z125 Encounter for screening for malignant neoplasm of prostate: Secondary | ICD-10-CM | POA: Diagnosis not present

## 2019-11-27 DIAGNOSIS — R5383 Other fatigue: Secondary | ICD-10-CM | POA: Diagnosis not present

## 2019-11-27 DIAGNOSIS — M2550 Pain in unspecified joint: Secondary | ICD-10-CM | POA: Diagnosis not present

## 2019-11-27 DIAGNOSIS — I10 Essential (primary) hypertension: Secondary | ICD-10-CM | POA: Diagnosis not present

## 2019-11-27 DIAGNOSIS — F900 Attention-deficit hyperactivity disorder, predominantly inattentive type: Secondary | ICD-10-CM | POA: Diagnosis not present

## 2019-11-27 DIAGNOSIS — Z0000 Encounter for general adult medical examination without abnormal findings: Secondary | ICD-10-CM | POA: Diagnosis not present

## 2019-11-27 DIAGNOSIS — N200 Calculus of kidney: Secondary | ICD-10-CM | POA: Diagnosis not present

## 2019-11-27 DIAGNOSIS — Z79899 Other long term (current) drug therapy: Secondary | ICD-10-CM | POA: Diagnosis not present

## 2019-12-23 DIAGNOSIS — J029 Acute pharyngitis, unspecified: Secondary | ICD-10-CM | POA: Diagnosis not present

## 2019-12-23 DIAGNOSIS — J22 Unspecified acute lower respiratory infection: Secondary | ICD-10-CM | POA: Diagnosis not present

## 2019-12-23 DIAGNOSIS — Z03818 Encounter for observation for suspected exposure to other biological agents ruled out: Secondary | ICD-10-CM | POA: Diagnosis not present

## 2019-12-30 DIAGNOSIS — R42 Dizziness and giddiness: Secondary | ICD-10-CM | POA: Insufficient documentation

## 2019-12-30 DIAGNOSIS — R001 Bradycardia, unspecified: Secondary | ICD-10-CM | POA: Insufficient documentation

## 2020-01-01 ENCOUNTER — Other Ambulatory Visit: Payer: Self-pay

## 2020-01-01 ENCOUNTER — Encounter: Payer: Self-pay | Admitting: Internal Medicine

## 2020-01-01 ENCOUNTER — Telehealth: Payer: Self-pay | Admitting: Radiology

## 2020-01-01 ENCOUNTER — Ambulatory Visit (INDEPENDENT_AMBULATORY_CARE_PROVIDER_SITE_OTHER): Payer: BC Managed Care – PPO | Admitting: Internal Medicine

## 2020-01-01 VITALS — BP 98/70 | HR 62 | Ht 67.0 in | Wt 179.8 lb

## 2020-01-01 DIAGNOSIS — R5383 Other fatigue: Secondary | ICD-10-CM

## 2020-01-01 DIAGNOSIS — R42 Dizziness and giddiness: Secondary | ICD-10-CM

## 2020-01-01 DIAGNOSIS — Z72820 Sleep deprivation: Secondary | ICD-10-CM

## 2020-01-01 DIAGNOSIS — R001 Bradycardia, unspecified: Secondary | ICD-10-CM | POA: Diagnosis not present

## 2020-01-01 DIAGNOSIS — R002 Palpitations: Secondary | ICD-10-CM

## 2020-01-01 DIAGNOSIS — R350 Frequency of micturition: Secondary | ICD-10-CM | POA: Diagnosis not present

## 2020-01-01 DIAGNOSIS — N281 Cyst of kidney, acquired: Secondary | ICD-10-CM | POA: Diagnosis not present

## 2020-01-01 DIAGNOSIS — Z87442 Personal history of urinary calculi: Secondary | ICD-10-CM | POA: Diagnosis not present

## 2020-01-01 NOTE — Patient Instructions (Addendum)
Medication Instructions:  Your physician recommends that you continue on your current medications as directed. Please refer to the Current Medication list given to you today. *If you need a refill on your cardiac medications before your next appointment, please call your pharmacy*   Lab Work: None ordered.  If you have labs (blood work) drawn today and your tests are completely normal, you will receive your results only by:  MyChart Message (if you have MyChart) OR  A paper copy in the mail If you have any lab test that is abnormal or we need to change your treatment, we will call you to review the results.   Testing/Procedures:  Your physician has recommended that you have a sleep study. This test records several body functions during sleep, including: brain activity, eye movement, oxygen and carbon dioxide blood levels, heart rate and rhythm, breathing rate and rhythm, the flow of air through your mouth and nose, snoring, body muscle movements, and chest and belly movement.  ZIO XT- Long Term Monitor Instructions   Your physician has requested you wear your ZIO patch monitor_____14__days.   This is a single patch monitor.  [employer] supplies one patch monitor per enrollment.  Additional stickers are not available.   Please do not apply patch if you will be having a Nuclear Stress Test, Echocardiogram, Cardiac CT, MRI, or Chest Xray during the time frame you would be wearing the monitor. The patch cannot be worn during these tests.  You cannot remove and re-apply the ZIO XT patch monitor.   Your ZIO patch monitor will be sent USPS Priority mail from [facility] directly to your home address. The monitor may also be mailed to a PO BOX if home delivery is not available.   It may take 3-5 days to receive your monitor after you have been enrolled.   Once you have received you monitor, please review enclosed instructions.  Your monitor has already been registered assigning a specific  monitor serial # to you.   Applying the monitor   Shave hair from upper left chest.   Hold abrader disc by orange tab.  Rub abrader in 40 strokes over left upper chest as indicated in your monitor instructions.   Clean area with 4 enclosed alcohol pads .  Use all pads to assure are is cleaned thoroughly.  Let dry.   Apply patch as indicated in monitor instructions.  Patch will be place under collarbone on left side of chest with arrow pointing upward.   Rub patch adhesive wings for 2 minutes.Remove white label marked "1".  Remove white label marked "2".  Rub patch adhesive wings for 2 additional minutes.   While looking in a mirror, press and release button in center of patch.  A small green light will flash 3-4 times .  This will be your only indicator the monitor has been turned on.     Do not shower for the first 24 hours.  You may shower after the first 24 hours.   Press button if you feel a symptom. You will hear a small click.  Record Date, Time and Symptom in the Patient Log Book.   When you are ready to remove patch, follow instructions on last 2 pages of Patient Log Book.  Stick patch monitor onto last page of Patient Log Book.   Place Patient Log Book in [place] box.  Use locking tab on box and tape box closed securely.  The [place] and [employer] has [employer] on  it.  Please place in mailbox as soon as possible.  Your physician should have your test results approximately 7 days after the monitor has been mailed back to [facility].   Call [facility] Customer Care at [phone] if you have questions regarding your ZIO XT patch monitor.  Call them immediately if you see an orange light blinking on your monitor.   If your monitor falls off in less than 4 days contact our Monitor department at [phone].  If your monitor becomes loose or falls off after 4 days call [employer] at [phone] for suggestions on securing your monitor.     Follow-Up: At [facility],  you and your health needs are our priority.  As part of our continuing mission to provide you with exceptional heart care, we have created designated Provider Care Teams.  These Care Teams include your primary Cardiologist (physician) and Advanced Practice Providers (APPs -  Physician Assistants and Nurse Practitioners) who all work together to provide you with the care you need, when you need it.  We recommend signing up for the patient portal called "MyChart".  Sign up information is provided on this After Visit Summary.  MyChart is used to connect with patients for Virtual Visits (Telemedicine).  Patients are able to view lab/test results, encounter notes, upcoming appointments, etc.  Non-urgent messages can be sent to your provider as well.   To learn more about what you can do with MyChart, go to [email or web address].    Your next appointment:  Virtual VIsit 02/06/2020 at 315pm with Dr Graciela Husbands

## 2020-01-01 NOTE — Telephone Encounter (Signed)
Enrolled patient for a 14 day Zio XT  monitor to be mailed to patients home  °

## 2020-01-01 NOTE — Progress Notes (Signed)
ELECTROPHYSIOLOGY CONSULT NOTE  Patient ID: Christopher Gordon, MRN: [id number], DOB/AGE: January 11, 1956 64 y.o. Admit date: (Not on file) Date of Consult: 01/01/2020  Primary Physician: Sigmund Hazel, MD Primary Cardiologist: MS     Christopher Gordon is a 64 y.o. male who is being seen today for the evaluation of bradycardia at the request of Dr. Sigmund Hazel.    HPI Christopher Gordon is a 64 y.o. male referred because of fatigue over the last year in the context of known bradycardia.  He has a longstanding history of bradycardia noted in the chart several back many years.  He was evaluated in 2018 by Dr. MS with stress testing demonstrating adequate heart rate response with a peak maximal heart rate of 160.  It was false positive he underwent CTA demonstrating coronary artery calcification but no obstructive disease.  He had been fit in the past but with Covid has been exercising less.  He describes himself now as sedentary.  He describes fatigue as a lack of enthusiasm to do things.  He notes that he is more irritable.  He is sleeping poorly.  He has a high profile high stress job which he has been doing at home for most 2 years now.  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  His wife is asking whether he is depressed.  Because of his sleep disturbances, his PCP has put him on Lexapro without significant benefit  He has adult onset ADD for which he takes Ritalin  DATE TEST EF   7/18 GXT   % Pk HR 161  8/18 CTA    % Nonobstructive LAD        Date Cr K TSH Hgb  12/20 1.00<1.39 3.8     7//21 0.87 4.6 0.63 14.9      Past Medical History:  Diagnosis Date  . ADD (attention deficit disorder)   . Allergic rhinitis   . Arthritis   . C. difficile colitis   . Carpal tunnel syndrome   . Diverticulitis   . ED (erectile dysfunction)   . GERD (gastroesophageal reflux disease)   . Hemorrhoids   . History of kidney stones   . Hyperlipidemia   .  Hypertension   . Irregular heart beat   . Kidney lesion    Left side  . Kidney stone   . Left ureteral calculus   . Low testosterone   . Nephrolithiasis    right side calculi  . Wears glasses   . Wears partial dentures    UPPER AND LOWER      Surgical History:  Past Surgical History:  Procedure Laterality Date  . COLONOSCOPY    . CYSTOSCOPY W/ URETERAL STENT PLACEMENT Right 04/24/2019   Procedure: CYSTOSCOPY WITH RETROGRADE PYELOGRAM/URETERAL STENT PLACEMENT;  Surgeon: Marcine Matar, MD;  Location: [facility];  Service: Urology;  Laterality: Right;  . CYSTOSCOPY WITH STENT PLACEMENT Left 07/31/2014   Procedure: CYSTOSCOPY WITH STENT PLACEMENT;  Surgeon: Bjorn Pippin, MD;  Location: [facility];  Service: Urology;  Laterality: Left;  . CYSTOSCOPY/RETROGRADE/URETEROSCOPY/STONE EXTRACTION WITH BASKET Right 05/07/2019   Procedure: CYSTOSCOPY/URETEROSCOPY/BASKET EXTRACTION/STENT EXCHANGE;  Surgeon: Bjorn Pippin, MD;  Location: [facility];  Service: Urology;  Laterality: Right;  . EXTRACORPOREAL SHOCK WAVE LITHOTRIPSY Right 04/22/2019   Procedure: EXTRACORPOREAL SHOCK WAVE LITHOTRIPSY (ESWL);  Surgeon: Crista Elliot, MD;  Location: [facility];  Service: Urology;  Laterality: Right;  . KNEE SURGERY Right 04/2018  . 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 SHOULDER OPEN ROTATOR CUFF REPAIR Left 2011     Home Meds: Current Meds  Medication Sig  . aspirin EC 81 MG tablet Take 81 mg by mouth daily. Not currently taking  . atorvastatin (LIPITOR) 40 MG tablet TAKE 1 TABLET DAILY (PLEASE CALL OFFICE AND SCHEDULE OVERDUE YEARLY APPOINTMENT FOR FURTHER REFILLS. SECOND ATTEMPT)  . B Complex-Folic Acid (B COMPLEX PLUS PO) Take 1 tablet by mouth daily.  . Cholecalciferol (VITAMIN D3) 1.25 MG (50000 UT) TABS Take 1 tablet by mouth daily.  Marland Kitchen escitalopram (LEXAPRO) 10 MG tablet Take 10 mg by mouth daily.  Marland Kitchen HYDROmorphone  (DILAUDID) 4 MG tablet Take 0.5 tablets (2 mg total) by mouth every 4 (four) hours as needed for severe pain.  Marland Kitchen losartan (COZAAR) 50 MG tablet Take 50 mg by mouth daily.   . meloxicam (MOBIC) 15 MG tablet Take 15 mg by mouth as needed.   . methylphenidate (RITALIN) 10 MG tablet Take 10 mg by mouth 3 (three) times daily.  Marland Kitchen omeprazole (PRILOSEC) 20 MG capsule Take 20 mg by mouth 3 times/day as needed-between meals & bedtime (HEARTBURN).   Marland Kitchen ondansetron (ZOFRAN ODT) 8 MG disintegrating tablet Take 1 tablet (8 mg total) by mouth every 8 (eight) hours as needed for nausea or vomiting.  Marland Kitchen oxybutynin (DITROPAN) 5 MG tablet Take 1 tablet (5 mg total) by mouth every 8 (eight) hours as needed for up to 15 doses for bladder spasms.  . potassium citrate (UROCIT-K) 10 MEQ (1080 MG) SR tablet Take 10 mEq by mouth 2 (two) times daily.  Marland Kitchen sulfamethoxazole-trimethoprim (BACTRIM DS) 800-160 MG tablet Take 1 tablet by mouth 2 (two) times daily.  . [DISCONTINUED] methylphenidate (RITALIN) 20 MG tablet Take 20 mg by mouth 3 (three) times daily with meals.   . [DISCONTINUED] Multiple Vitamins-Minerals (MULTIVITAMIN ADULTS 50+ PO) Take 1 tablet by mouth daily.  . [DISCONTINUED] oxyCODONE-acetaminophen (PERCOCET/ROXICET) 5-325 MG tablet Take 1 tablet by mouth every 4 (four) hours as needed for severe pain.    Allergies:  Allergies  Allergen Reactions  . Crestor [Rosuvastatin] Other (See Comments)    "body aches"  . Demerol [Meperidine] Nausea And Vomiting    Social History   Socioeconomic History  . Marital status: Married    Spouse name: Not on file  . Number of children: Not on file  . Years of education: Not on file  . Highest education level: Not on file  Occupational History  . Not on file  Tobacco Use  . Smoking status: Former Smoker    Years: 9.00    Types: Cigarettes    Quit date: 07/29/1984    Years since quitting: 35.4  . Smokeless tobacco: Former Neurosurgeon    Types: Snuff    Quit date:  07/29/1988  Vaping Use  . Vaping Use: Never used  Substance and Sexual Activity  . Alcohol use: Not Currently  . Drug use: No  . Sexual activity: Not on file  Other Topics Concern  . Not on file  Social History Narrative  . Not on file   Social Determinants of Health   Financial Resource Strain:   . Difficulty of Paying Living Expenses: Not on file  Food Insecurity:   . Worried About Programme researcher, broadcasting/film/video in the Last Year: Not on file  . Ran Out of Food in the Last Year: Not on file  Transportation Needs:   . Freight forwarder (Medical): Not on file  . Lack of Transportation (Non-Medical): Not on file  Physical Activity:   . Days of Exercise per Week: Not on file  . Minutes of Exercise per Session: Not on file  Stress:   . Feeling of Stress : Not on file  Social Connections:   . Frequency of Communication with Friends and Family: Not on file  . Frequency of Social Gatherings with Friends and Family: Not on file  . Attends Religious Services: Not on file  . Active Member of Clubs or Organizations: Not on file  . Attends Banker Meetings: Not on file  . Marital Status: Not on file  Intimate Partner Violence:   . Fear of Current or Ex-Partner: Not on file  . Emotionally Abused: Not on file  . Physically Abused: Not on file  . Sexually Abused: Not on file     Family History  Problem Relation Age of Onset  . Diabetes Mother   . Hypertension Mother   . Heart disease Mother        several stents  . Hyperlipidemia Mother   . Alzheimer's disease Father   . Diabetes Father   . Hypertension Father   . Heart attack Maternal Grandmother   . Heart attack Paternal Grandmother   . Heart attack Paternal Grandfather   . Diabetes Brother   . Hypertension Brother      ROS:  Please see the history of present illness.     All other systems reviewed and negative.    Physical Exam: Blood pressure 98/70, pulse 62, height 5\' 7"  (1.702 m), weight 179 lb 12.8 oz (81.6  kg), SpO2 94 %. General: Well developed, well nourished male in no acute distress. Head: Normocephalic, atraumatic, sclera non-icteric, no xanthomas, nares are without discharge. EENT: normal  Lymph Nodes:  none Neck: Negative for carotid bruits. JVD not elevated. Back:without scoliosis kyphosis Lungs: Clear bilaterally to auscultation without wheezes, rales, or rhonchi. Breathing is unlabored. Heart: RRR with S1 S2. No murmur . No rubs, or gallops appreciated. Abdomen: Soft, non-tender, non-distended with normoactive bowel sounds. No hepatomegaly. No rebound/guarding. No obvious abdominal masses. Msk:  Strength and tone appear normal for age. Extremities: No clubbing or cyanosis. No edema.  Distal pedal pulses are 2+ and equal bilaterally. Skin: Warm and Dry Neuro: Alert and oriented X 3. CN III-XII intact Grossly normal sensory and motor function . Psych:  Responds to questions appropriately with a normal affect.      Labs: Cardiac Enzymes No results for input(s): CKTOTAL, CKMB, TROPONINI in the last 72 hours. CBC Lab Results  Component Value Date   WBC 14.2 (H) 04/18/2019   WBC 13.6 (H) 04/18/2019   HGB 15.0 05/07/2019   HCT 44.0 05/07/2019   MCV 90.8 04/18/2019   MCV 90.9 04/18/2019   PLT 231 04/18/2019   PLT 237 04/18/2019   PROTIME: No results for input(s): LABPROT, INR in the last 72 hours. Chemistry No results for input(s): NA, K, CL, CO2, BUN, CREATININE, CALCIUM, PROT, BILITOT, ALKPHOS, ALT, AST, GLUCOSE in the last 168 hours.  Invalid input(s): LABALBU Lipids No results found for: CHOL, HDL, LDLCALC, TRIG BNP No results found for: PROBNP Thyroid Function Tests: No results for input(s): TSH, T4TOTAL, T3FREE, THYROIDAB in the last 72 hours.  Invalid input(s): FREET3 Miscellaneous No results found for: DDIMER  Radiology/Studies:  No results found.  EKG: Sinus at 62 Oral 16/10/38 Frequent atrial ectopy including in  couplets   Assessment and Plan:   FAtigue  Sleep disordered breathing  Bradycardia  Atrial ectopy and couplets   The patient has a longstanding history of bradycardia and when chronotropic response assessed by treadmill testing in 2018 it was normal.  The longstanding nature of his bradycardia begs the issue as to what is responsible event for the fatigue.  ECG demonstrates atrial runs a rate of about 95, not normally an issue but compared to his baseline of 40s may be contributing to his symptoms.  We will use an event recorder to look at his heart rate excursion and see if we can correlate any of his symptoms with an arrhythmia.  His low blood pressure today seems to be an anomalous.  When he saw Dr. Hyacinth MeekerMiller is 138.  If he has protracted low blood pressure will consider cortisol.  Sleep disordered breathing suggest the possibility of sleep apnea.  We will order a sleep study.  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  I wonder whether there is a depressive component.  He notes that his wife has asked him the same question.  He denies hopelessness but does acknowledge irritability sleep disturbances in conjunction with the fatigue.  Have suggested he follow-up with Dr. Hyacinth MeekerMiller concerning an alternative to the Lexapro.  Also suggested that he consider finding some man to service Christopher Gordon and/or Christopher BreezeHur  Quantay Faythe Gordon

## 2020-01-02 DIAGNOSIS — R05 Cough: Secondary | ICD-10-CM | POA: Diagnosis not present

## 2020-01-02 DIAGNOSIS — U071 COVID-19: Secondary | ICD-10-CM | POA: Diagnosis not present

## 2020-01-02 DIAGNOSIS — Z1152 Encounter for screening for COVID-19: Secondary | ICD-10-CM | POA: Diagnosis not present

## 2020-01-05 ENCOUNTER — Encounter: Payer: Self-pay | Admitting: Oncology

## 2020-01-05 ENCOUNTER — Other Ambulatory Visit (HOSPITAL_COMMUNITY): Payer: Self-pay | Admitting: Oncology

## 2020-01-05 DIAGNOSIS — U071 COVID-19: Secondary | ICD-10-CM

## 2020-01-05 NOTE — Progress Notes (Signed)
I connected by phone with  Mr. Warshawsky  to discuss the potential use of an new treatment for mild to moderate COVID-19 viral infection in non-hospitalized patients.   This patient is a age/sex that meets the FDA criteria for Emergency Use Authorization of casirivimab\imdevimab.  Has a (+) direct SARS-CoV-2 viral test result 1. Has mild or moderate COVID-19  2. Is ? 63 years of age and weighs ? 40 kg 3. Is NOT hospitalized due to COVID-19 4. Is NOT requiring oxygen therapy or requiring an increase in baseline oxygen flow rate due to COVID-19 5. Is within 10 days of symptom onset 6. Has at least one of the high risk factor(s) for progression to severe COVID-19 and/or hospitalization as defined in EUA. ? Specific high risk criteria :CAD   Symptom onset 12/31/19   I have spoken and communicated the following to the patient or parent/caregiver:   1. FDA has authorized the emergency use of casirivimab\imdevimab for the treatment of mild to moderate COVID-19 in adults and pediatric patients with positive results of direct SARS-CoV-2 viral testing who are 39 years of age and older weighing at least 40 kg, and who are at high risk for progressing to severe COVID-19 and/or hospitalization.   2. The significant known and potential risks and benefits of casirivimab\imdevimab, and the extent to which such potential risks and benefits are unknown.   3. Information on available alternative treatments and the risks and benefits of those alternatives, including clinical trials.   4. Patients treated with casirivimab\imdevimab should continue to self-isolate and use infection control measures (e.g., wear mask, isolate, social distance, avoid sharing personal items, clean and disinfect "high touch" surfaces, and frequent handwashing) according to CDC guidelines.    5. The patient or parent/caregiver has the option to accept or refuse casirivimab\imdevimab .   After reviewing this information with the patient,  The patient agreed to proceed with receiving casirivimab\imdevimab infusion and will be provided a copy of the Fact sheet prior to receiving the infusion.Mignon Pine, AGNP-C [phone] (Infusion Center Hotline)

## 2020-01-06 ENCOUNTER — Telehealth: Payer: Self-pay | Admitting: Internal Medicine

## 2020-01-06 ENCOUNTER — Ambulatory Visit (HOSPITAL_COMMUNITY)
Admission: RE | Admit: 2020-01-06 | Discharge: 2020-01-06 | Disposition: A | Discharge status: Home / Self Care | Payer: BC Managed Care – PPO | Source: Ambulatory Visit | Attending: Pulmonary Disease | Admitting: Pulmonary Disease

## 2020-01-06 ENCOUNTER — Telehealth: Payer: Self-pay | Admitting: *Deleted

## 2020-01-06 DIAGNOSIS — U071 COVID-19: Secondary | ICD-10-CM | POA: Diagnosis not present

## 2020-01-06 MED ORDER — EPINEPHRINE 0.3 MG/0.3ML IJ SOAJ: 0.3000 mg | Freq: Once | INTRAMUSCULAR | Status: DC | PRN

## 2020-01-06 MED ORDER — SODIUM CHLORIDE 0.9 % IV SOLN
1200.0000 mg | Freq: Once | INTRAVENOUS | Status: AC
  Administered 2020-01-06: 1200 mg via INTRAVENOUS

## 2020-01-06 MED ORDER — FAMOTIDINE IN NACL 20-0.9 MG/50ML-% IV SOLN: 20.0000 mg | Freq: Once | INTRAVENOUS | Status: DC | PRN

## 2020-01-06 MED ORDER — SODIUM CHLORIDE 0.9 % IV SOLN: INTRAVENOUS | Status: DC | PRN

## 2020-01-06 MED ORDER — METHYLPREDNISOLONE SODIUM SUCC 125 MG IJ SOLR: 125.0000 mg | Freq: Once | INTRAMUSCULAR | Status: DC | PRN

## 2020-01-06 MED ORDER — DIPHENHYDRAMINE HCL 50 MG/ML IJ SOLN: 50.0000 mg | Freq: Once | INTRAMUSCULAR | Status: DC | PRN

## 2020-01-06 MED ORDER — ALBUTEROL SULFATE HFA 108 (90 BASE) MCG/ACT IN AERS: 2.0000 | INHALATION_SPRAY | Freq: Once | RESPIRATORY_TRACT | Status: DC | PRN

## 2020-01-06 NOTE — Telephone Encounter (Signed)
-----   Message from Alois Cliche, RN sent at 01/01/2020  5:38 PM EDT ----- Regarding: Sleep study Please precert and schedule pt.  Thank you,  Mindi Junker

## 2020-01-06 NOTE — Telephone Encounter (Signed)
Patient received his heart monitor on Thursday - he also tested positive for covid late Thursday evening. He wants to know if he should wait to put his heart monitor on until after his 14 days.

## 2020-01-06 NOTE — Telephone Encounter (Signed)
Patient is scheduled for lab study on 01/31/20. Patient understands his sleep study will be done at [facility] sleep lab. Patient understands he will receive a sleep packet in a week or so. Patient understands to call if he does not receive the sleep packet in a timely manner. Patient agrees with treatment and thanked me for call.

## 2020-01-06 NOTE — Telephone Encounter (Signed)
[facility] called and has put on record patient tested positive for COVID.  Patient does not need to wait  14 days before applying monitor.

## 2020-01-06 NOTE — Discharge Instructions (Signed)

## 2020-01-07 ENCOUNTER — Other Ambulatory Visit (INDEPENDENT_AMBULATORY_CARE_PROVIDER_SITE_OTHER): Payer: BC Managed Care – PPO

## 2020-01-07 DIAGNOSIS — R001 Bradycardia, unspecified: Secondary | ICD-10-CM

## 2020-01-07 DIAGNOSIS — R42 Dizziness and giddiness: Secondary | ICD-10-CM

## 2020-01-07 DIAGNOSIS — R002 Palpitations: Secondary | ICD-10-CM

## 2020-01-31 ENCOUNTER — Encounter (HOSPITAL_BASED_OUTPATIENT_CLINIC_OR_DEPARTMENT_OTHER): Payer: BC Managed Care – PPO | Admitting: Cardiology

## 2020-02-06 ENCOUNTER — Telehealth (INDEPENDENT_AMBULATORY_CARE_PROVIDER_SITE_OTHER): Payer: BC Managed Care – PPO | Admitting: Internal Medicine

## 2020-02-06 ENCOUNTER — Other Ambulatory Visit: Payer: Self-pay

## 2020-02-06 VITALS — HR 99 | Ht 67.0 in | Wt 180.0 lb

## 2020-02-06 DIAGNOSIS — R001 Bradycardia, unspecified: Secondary | ICD-10-CM

## 2020-02-06 NOTE — Patient Instructions (Addendum)
Medication Instructions:  Your physician recommends that you continue on your current medications as directed. Please refer to the Current Medication list given to you today.   *If you need a refill on your cardiac medications before your next appointment, please call your pharmacy*   Lab Work: None ordered.  If you have labs (blood work) drawn today and your tests are completely normal, you will receive your results only by: Marland Kitchen MyChart Message (if you have MyChart) OR . A paper copy in the mail If you have any lab test that is abnormal or we need to change your treatment, we will call you to review the results.   Testing/Procedures: None ordered.    Follow-Up: At [facility], you and your health needs are our priority.  As part of our continuing mission to provide you with exceptional heart care, we have created designated Provider Care Teams.  These Care Teams include your primary Cardiologist (physician) and Advanced Practice Providers (APPs -  Physician Assistants and Nurse Practitioners) who all work together to provide you with the care you need, when you need it.  We recommend signing up for the patient portal called "MyChart".  Sign up information is provided on this After Visit Summary.  MyChart is used to connect with patients for Virtual Visits (Telemedicine).  Patients are able to view lab/test results, encounter notes, upcoming appointments, etc.  Non-urgent messages can be sent to your provider as well.   To learn more about what you can do with MyChart, go to [email or web address].    Your next appointment:   Virtual visit with Dr Graciela Husbands 02/13/2020 at 315pm

## 2020-02-06 NOTE — Progress Notes (Signed)
Electrophysiology TeleHealth Note   Due to national recommendations of social distancing due to COVID 19, an audio/video telehealth visit is felt to be most appropriate for this patient at this time.  See MyChart message from today for the patient's consent to telehealth for [facility].   Date:  02/06/2020   ID:  Christopher Gordon, DOB 25-Apr-1956, MRN [id number]  Location: patient's home  Provider location: [address], [place]  Evaluation Performed: Follow-up visit  PCP:  Sigmund Hazel, MD  Cardiologist:     Electrophysiologist:  SK   Chief Complaint:  Weak spells  History of Present Illness:    Christopher Gordon is a 64 y.o. male who presents via audio/video conferencing for a telehealth visit today.  Since last being seen in our clinic for episodic*fatigue which resolves  some atrial tachycardia *and some bradycardia the patient reports ongoing episodes of relatively abrupt onset sleepiness that tends to track with his HR slowing     Has correlated in part his Pulse Ox with HR 40s with fatigue and watches it HR going 40-70s -- not assoc w stress.  HR goes to 120's w modest exertion ( climbing stairs)  OTC magnesium had no impact.    Event Recorder personnally reviewed   The patient denies symptoms of fevers, chills, cough, or new SOB worrisome for COVID 19.    Past Medical History:  Diagnosis Date   ADD (attention deficit disorder)    Allergic rhinitis    Arthritis    C. difficile colitis    Carpal tunnel syndrome    Diverticulitis    ED (erectile dysfunction)    GERD (gastroesophageal reflux disease)    Hemorrhoids    History of kidney stones    Hyperlipidemia    Hypertension    Irregular heart beat    Kidney lesion    Left side   Kidney stone    Left ureteral calculus    Low testosterone    Nephrolithiasis    right side calculi   Wears glasses    Wears partial dentures    UPPER AND LOWER    Past Surgical History:    Procedure Laterality Date   COLONOSCOPY     CYSTOSCOPY W/ URETERAL STENT PLACEMENT Right 04/24/2019   Procedure: CYSTOSCOPY WITH RETROGRADE PYELOGRAM/URETERAL STENT PLACEMENT;  Surgeon: Marcine Matar, MD;  Location: [facility];  Service: Urology;  Laterality: Right;   CYSTOSCOPY WITH STENT PLACEMENT Left 07/31/2014   Procedure: CYSTOSCOPY WITH STENT PLACEMENT;  Surgeon: Bjorn Pippin, MD;  Location: [facility];  Service: Urology;  Laterality: Left;   CYSTOSCOPY/RETROGRADE/URETEROSCOPY/STONE EXTRACTION WITH BASKET Right 05/07/2019   Procedure: CYSTOSCOPY/URETEROSCOPY/BASKET EXTRACTION/STENT EXCHANGE;  Surgeon: Bjorn Pippin, MD;  Location: [facility];  Service: Urology;  Laterality: Right;   EXTRACORPOREAL SHOCK WAVE LITHOTRIPSY Right 04/22/2019   Procedure: EXTRACORPOREAL SHOCK WAVE LITHOTRIPSY (ESWL);  Surgeon: Crista Elliot, MD;  Location: [facility];  Service: Urology;  Laterality: Right;   KNEE SURGERY Right 04/2018   ORCHIOPEXY  as child   undescended testis   SHOULDER OPEN ROTATOR CUFF REPAIR Left 2011    Current Outpatient Medications  Medication Sig Dispense Refill   atorvastatin (LIPITOR) 40 MG tablet TAKE 1 TABLET DAILY (PLEASE CALL OFFICE AND SCHEDULE OVERDUE YEARLY APPOINTMENT FOR FURTHER REFILLS. SECOND ATTEMPT) 30 tablet 0   B Complex-Folic Acid (B COMPLEX PLUS PO) Take 1 tablet by mouth daily.     Cholecalciferol (VITAMIN  D3) 1.25 MG (50000 UT) TABS Take 1 tablet by mouth daily.     escitalopram (LEXAPRO) 10 MG tablet Take 10 mg by mouth daily.     losartan (COZAAR) 50 MG tablet Take 50 mg by mouth daily.      meloxicam (MOBIC) 15 MG tablet Take 15 mg by mouth as needed.      potassium citrate (UROCIT-K) 10 MEQ (1080 MG) SR tablet Take 10 mEq by mouth 2 (two) times daily.     aspirin EC 81 MG tablet Take 81 mg by mouth daily. Not currently taking (Patient not taking: Reported on 02/06/2020)      HYDROmorphone (DILAUDID) 4 MG tablet Take 0.5 tablets (2 mg total) by mouth every 4 (four) hours as needed for severe pain. (Patient not taking: Reported on 02/06/2020) 10 tablet 0   methylphenidate (RITALIN) 10 MG tablet Take 10 mg by mouth 2 (two) times daily with breakfast and lunch.      omeprazole (PRILOSEC) 20 MG capsule Take 20 mg by mouth 3 times/day as needed-between meals & bedtime (HEARTBURN).  (Patient not taking: Reported on 02/06/2020)     ondansetron (ZOFRAN ODT) 8 MG disintegrating tablet Take 1 tablet (8 mg total) by mouth every 8 (eight) hours as needed for nausea or vomiting. (Patient not taking: Reported on 02/06/2020) 20 tablet 0   oxybutynin (DITROPAN) 5 MG tablet Take 1 tablet (5 mg total) by mouth every 8 (eight) hours as needed for up to 15 doses for bladder spasms. (Patient not taking: Reported on 02/06/2020) 45 tablet 1   sulfamethoxazole-trimethoprim (BACTRIM DS) 800-160 MG tablet Take 1 tablet by mouth 2 (two) times daily. 10 tablet 0   No current facility-administered medications for this visit.    Allergies:   Crestor [rosuvastatin] and Demerol [meperidine]   Social History:  The patient  reports that he quit smoking about 35 years ago. His smoking use included cigarettes. He quit after 9.00 years of use. He quit smokeless tobacco use about 31 years ago.  His smokeless tobacco use included snuff. He reports previous alcohol use. He reports that he does not use drugs.   Family History:  The patient's   family history includes Alzheimer's disease in his father; Diabetes in his brother, father, and mother; Heart attack in his maternal grandmother, paternal grandfather, and paternal grandmother; Heart disease in his mother; Hyperlipidemia in his mother; Hypertension in his brother, father, and mother.   ROS:  Please see the history of present illness.   All other systems are personally reviewed and negative.    Exam:    Vital Signs:  Pulse 99    Ht 5\' 7"  (1.702 m)     Wt 180 lb (81.6 kg)    SpO2 95%    BMI 28.19 kg/m    Well appearing, alert and conversant, regular work of breathing,  good skin color Eyes- anicteric, neuro- grossly intact, skin- no apparent rash or lesions or cyanosis, mouth- oral mucosa is pink   Labs/Other Tests and Data Reviewed:    Recent Labs: 04/18/2019: ALT 49; Platelets 231; Platelets 237 05/07/2019: BUN 24; Creatinine, Ser 1.00; Hemoglobin 15.0; Potassium 3.8; Sodium 143   Wt Readings from Last 3 Encounters:  02/06/20 180 lb (81.6 kg)  01/01/20 179 lb 12.8 oz (81.6 kg)  05/07/19 169 lb 11.2 oz (77 kg)     Other studies personally reviewed: Additional studies/ records that were reviewed today include:     ASSESSMENT & PLAN:    Fatigue  Sleep disordered breathing  Bradycardia  Atrial ectopy and couplets   Episodes of weakness assoc withHR 40 and some changes in HR 70-40s  But also with some weakness with HR 40s 9 perhaps without transitions  No clear autonomic triggers for variable HR, -- could this be a sinus node problem  The brevity of the fatigue makes a change in HR a reasonable explanation  We discussed the possibility of pacing for relief of symptoms   He is not averse   COVID 19 screen The patient denies symptoms of COVID 19 at this time.  The importance of social distancing was discussed today.  Follow-up:  telehealth visit  Next week     Current medicines are reviewed at length with the patient today.   The patient does not have concerns regarding his medicines.  The following changes were made today:  none  Labs/ tests ordered today include:   No orders of the defined types were placed in this encounter.   Future tests ( post COVID )     Patient Risk:  after full review of this patients clinical status, I feel that they are at moderate  risk at this time.  Today, I have spent 31 minutes with the patient with telehealth technology discussing the above.  Signed, Sherryl Manges, MD   02/06/2020 3:19 PM     [facility] [address] [place] [address] [phone] (office) [phone] (fax)

## 2020-02-13 ENCOUNTER — Telehealth (INDEPENDENT_AMBULATORY_CARE_PROVIDER_SITE_OTHER): Payer: BC Managed Care – PPO | Admitting: Internal Medicine

## 2020-02-13 ENCOUNTER — Telehealth: Payer: Self-pay

## 2020-02-13 ENCOUNTER — Other Ambulatory Visit: Payer: Self-pay

## 2020-02-13 VITALS — HR 71 | Wt 180.0 lb

## 2020-02-13 DIAGNOSIS — R001 Bradycardia, unspecified: Secondary | ICD-10-CM | POA: Diagnosis not present

## 2020-02-13 NOTE — Patient Instructions (Signed)
Medication Instructions:  Your physician recommends that you continue on your current medications as directed. Please refer to the Current Medication list given to you today.  *If you need a refill on your cardiac medications before your next appointment, please call your pharmacy*   Lab Work: None ordered.  If you have labs (blood work) drawn today and your tests are completely normal, you will receive your results only by: . MyChart Message (if you have MyChart) OR . A paper copy in the mail If you have any lab test that is abnormal or we need to change your treatment, we will call you to review the results.   Testing/Procedures: None ordered.    Follow-Up: At [facility], you and your health needs are our priority.  As part of our continuing mission to provide you with exceptional heart care, we have created designated Provider Care Teams.  These Care Teams include your primary Cardiologist (physician) and Advanced Practice Providers (APPs -  Physician Assistants and Nurse Practitioners) who all work together to provide you with the care you need, when you need it.  We recommend signing up for the patient portal called "MyChart".  Sign up information is provided on this After Visit Summary.  MyChart is used to connect with patients for Virtual Visits (Telemedicine).  Patients are able to view lab/test results, encounter notes, upcoming appointments, etc.  Non-urgent messages can be sent to your provider as well.   To learn more about what you can do with MyChart, go to [email or web address].    Your next appointment:   4 month(s)  The format for your next appointment:   In Person  Provider:   Tanvir Klein, MD     

## 2020-02-13 NOTE — Progress Notes (Signed)
Electrophysiology TeleHealth Note   Due to national recommendations of social distancing due to COVID 19, an audio/video telehealth visit is felt to be most appropriate for this patient at this time.  See MyChart message from today for the patient's consent to telehealth for [facility].   Date:  02/13/2020   ID:  Christopher Gordon, DOB [birth year], MRN [id number]  Location: patient's home  Provider location: [address] [place]  Evaluation Performed: Follow-up visit  PCP:  Sigmund Hazel, MD  Cardiologist:     Electrophysiologist:  SK   Chief Complaint:  Weak spells  History of Present Illness:    Christopher Gordon is a 64 y.o. male who presents via audio/video conferencing for a telehealth visit today.  Since last being seen in our clinic for episodic (very brief)fatigue,  some atrial tachycardia *and some bradycardia the patient reports ongoing episodes of relatively abrupt onset sleepiness--no interval dizziness or weakness  ;  HR has ranged from 40--80s; but has not noted any change in symptoms HR>>120-50s with exertion as previously.       Event Recorder personnally reviewed again (:)) The patient denies symptoms of fevers, chills, cough, or new SOB worrisome for COVID 19.    Past Medical History:  Diagnosis Date  . ADD (attention deficit disorder)   . Allergic rhinitis   . Arthritis   . C. difficile colitis   . Carpal tunnel syndrome   . Diverticulitis   . ED (erectile dysfunction)   . GERD (gastroesophageal reflux disease)   . Hemorrhoids   . History of kidney stones   . Hyperlipidemia   . Hypertension   . Irregular heart beat   . Kidney lesion    Left side  . Kidney stone   . Left ureteral calculus   . Low testosterone   . Nephrolithiasis    right side calculi  . Wears glasses   . Wears partial dentures    UPPER AND LOWER    Past Surgical History:  Procedure Laterality Date  . COLONOSCOPY    . CYSTOSCOPY W/ URETERAL STENT PLACEMENT  Right 04/24/2019   Procedure: CYSTOSCOPY WITH RETROGRADE PYELOGRAM/URETERAL STENT PLACEMENT;  Surgeon: Marcine Matar, MD;  Location: [facility];  Service: Urology;  Laterality: Right;  . CYSTOSCOPY WITH STENT PLACEMENT Left 07/31/2014   Procedure: CYSTOSCOPY WITH STENT PLACEMENT;  Surgeon: Bjorn Pippin, MD;  Location: [facility];  Service: Urology;  Laterality: Left;  . CYSTOSCOPY/RETROGRADE/URETEROSCOPY/STONE EXTRACTION WITH BASKET Right 05/07/2019   Procedure: CYSTOSCOPY/URETEROSCOPY/BASKET EXTRACTION/STENT EXCHANGE;  Surgeon: Bjorn Pippin, MD;  Location: [facility];  Service: Urology;  Laterality: Right;  . EXTRACORPOREAL SHOCK WAVE LITHOTRIPSY Right 04/22/2019   Procedure: EXTRACORPOREAL SHOCK WAVE LITHOTRIPSY (ESWL);  Surgeon: Crista Elliot, MD;  Location: [facility];  Service: Urology;  Laterality: Right;  . KNEE SURGERY Right 04/2018  . 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 SHOULDER OPEN ROTATOR CUFF REPAIR Left 2011    Current Outpatient Medications  Medication Sig Dispense Refill  . aspirin EC 81 MG tablet Take 81 mg by mouth daily. Not currently taking    . atorvastatin (LIPITOR) 40 MG tablet TAKE 1 TABLET DAILY (PLEASE CALL OFFICE AND SCHEDULE OVERDUE YEARLY APPOINTMENT FOR FURTHER REFILLS. SECOND ATTEMPT) 30 tablet 0  . B Complex-Folic Acid (B COMPLEX PLUS PO) Take 1 tablet by mouth daily.    . Cholecalciferol (VITAMIN D3) 1.25 MG (50000 UT) TABS Take  1 tablet by mouth daily.    Marland Kitchen escitalopram (LEXAPRO) 10 MG tablet Take 10 mg by mouth daily.    Marland Kitchen losartan (COZAAR) 50 MG tablet Take 50 mg by mouth daily.     . meloxicam (MOBIC) 15 MG tablet Take 15 mg by mouth as needed.     . methylphenidate (RITALIN) 10 MG tablet Take 10 mg by mouth 2 (two) times daily with breakfast and lunch.     . potassium citrate (UROCIT-K) 10 MEQ (1080 MG) SR tablet Take 10 mEq by mouth 2 (two) times daily.    Marland Kitchen HYDROmorphone  (DILAUDID) 4 MG tablet Take 0.5 tablets (2 mg total) by mouth every 4 (four) hours as needed for severe pain. (Patient not taking: Reported on 02/06/2020) 10 tablet 0  . omeprazole (PRILOSEC) 20 MG capsule Take 20 mg by mouth 3 times/day as needed-between meals & bedtime (HEARTBURN).  (Patient not taking: Reported on 02/06/2020)    . ondansetron (ZOFRAN ODT) 8 MG disintegrating tablet Take 1 tablet (8 mg total) by mouth every 8 (eight) hours as needed for nausea or vomiting. (Patient not taking: Reported on 02/06/2020) 20 tablet 0  . oxybutynin (DITROPAN) 5 MG tablet Take 1 tablet (5 mg total) by mouth every 8 (eight) hours as needed for up to 15 doses for bladder spasms. (Patient not taking: Reported on 02/06/2020) 45 tablet 1  . sulfamethoxazole-trimethoprim (BACTRIM DS) 800-160 MG tablet Take 1 tablet by mouth 2 (two) times daily. (Patient not taking: Reported on 02/13/2020) 10 tablet 0   No current facility-administered medications for this visit.    Allergies:   Crestor [rosuvastatin] and Demerol [meperidine]   Social History:  The patient  reports that he quit smoking about 35 years ago. His smoking use included cigarettes. He quit after 9.00 years of use. He quit smokeless tobacco use about 31 years ago.  His smokeless tobacco use included snuff. He reports previous alcohol use. He reports that he does not use drugs.   Family History:  The patient's   family history includes Alzheimer's disease in his father; Diabetes in his brother, father, and mother; Heart attack in his maternal grandmother, paternal grandfather, and paternal grandmother; Heart disease in his mother; Hyperlipidemia in his mother; Hypertension in his brother, father, and mother.   ROS:  Please see the history of present illness.   All other systems are personally reviewed and negative.    Exam:    Vital Signs:  Pulse 71   Wt 180 lb (81.6 kg)   SpO2 97%   BMI 28.19 kg/m        Labs/Other Tests and Data Reviewed:     Recent Labs: 04/18/2019: ALT 49; Platelets 231; Platelets 237 05/07/2019: BUN 24; Creatinine, Ser 1.00; Hemoglobin 15.0; Potassium 3.8; Sodium 143   Wt Readings from Last 3 Encounters:  02/13/20 180 lb (81.6 kg)  02/06/20 180 lb (81.6 kg)  01/01/20 179 lb 12.8 oz (81.6 kg)     Other studies personally reviewed: Additional studies/ records that were reviewed today include:     ASSESSMENT & PLAN:    Fatigue/dizziness abrupt onset  Sleep disordered breathing  Bradycardia- variable/abrupt HR changes  Atrial ectopy and couplets   Not clear what is the cause of weakness/fatigue spells.  The abrupt changes in HR may be responsible, but the absence of symptoms when he notes HR 40 v 80 is not convincing that his bradycardia, specifically onset, has anything to do with his symptoms  It may be  that repeat monitoring may be necessary--and could try 30d or implanted     COVID 19 screen The patient denies symptoms of COVID 19 at this time.  The importance of social distancing was discussed today.  Follow-up: 3-4 monts     Current medicines are reviewed at length with the patie nt today.   The patient does not have concerns regarding his medicines.  The following changes were made today:  none  Labs/ tests ordered today include:   No orders of the defined types were placed in this encounter.    months  Patient Risk:  after full review of this patients clinical status, I feel that they are at moderate  risk at this time.  Today, I have spent 9 minutes with the patient with telehealth technology discussing the above.  Signed, Sherryl Manges, MD  02/13/2020 3:21 PM     [facility] [address] [place] [address] [phone] (office) [phone] (fax)

## 2020-02-13 NOTE — Telephone Encounter (Signed)
  Patient Consent for Virtual Visit         Christopher Gordon has provided verbal consent on 02/13/2020 for a virtual visit (video or telephone).   CONSENT FOR VIRTUAL VISIT FOR:  Christopher Gordon  By participating in this virtual visit I agree to the following:  I hereby voluntarily request, consent and authorize [facility] and its employed or contracted physicians, physician assistants, nurse practitioners or other licensed health care professionals (the Practitioner), to provide me with telemedicine health care services (the "Services") as deemed necessary by the treating Practitioner. I acknowledge and consent to receive the Services by the Practitioner via telemedicine. I understand that the telemedicine visit will involve communicating with the Practitioner through live audiovisual communication technology and the disclosure of certain medical information by electronic transmission. I acknowledge that I have been given the opportunity to request an in-person assessment or other available alternative prior to the telemedicine visit and am voluntarily participating in the telemedicine visit.  I understand that I have the right to withhold or withdraw my consent to the use of telemedicine in the course of my care at any time, without affecting my right to future care or treatment, and that the Practitioner or I may terminate the telemedicine visit at any time. I understand that I have the right to inspect all information obtained and/or recorded in the course of the telemedicine visit and may receive copies of available information for a reasonable fee.  I understand that some of the potential risks of receiving the Services via telemedicine include:  Marland Kitchen Delay or interruption in medical evaluation due to technological equipment failure or disruption; . Information transmitted may not be sufficient (e.g. poor resolution of images) to allow for appropriate medical decision making by the Practitioner;  and/or  . In rare instances, security protocols could fail, causing a breach of personal health information.  Furthermore, I acknowledge that it is my responsibility to provide information about my medical history, conditions and care that is complete and accurate to the best of my ability. I acknowledge that Practitioner's advice, recommendations, and/or decision may be based on factors not within their control, such as incomplete or inaccurate data provided by me or distortions of diagnostic images or specimens that may result from electronic transmissions. I understand that the practice of medicine is not an exact science and that Practitioner makes no warranties or guarantees regarding treatment outcomes. I acknowledge that a copy of this consent can be made available to me via my patient portal [facility] MyChart), or I can request a printed copy by calling the office of [facility].    I understand that my insurance will be billed for this visit.   I have read or had this consent read to me. . I understand the contents of this consent, which adequately explains the benefits and risks of the Services being provided via telemedicine.  . I have been provided ample opportunity to ask questions regarding this consent and the Services and have had my questions answered to my satisfaction. . I give my informed consent for the services to be provided through the use of telemedicine in my medical care

## 2020-02-21 ENCOUNTER — Encounter (HOSPITAL_BASED_OUTPATIENT_CLINIC_OR_DEPARTMENT_OTHER): Payer: BC Managed Care – PPO | Admitting: Cardiology

## 2020-08-04 ENCOUNTER — Ambulatory Visit (INDEPENDENT_AMBULATORY_CARE_PROVIDER_SITE_OTHER): Payer: BC Managed Care – PPO | Admitting: Internal Medicine

## 2020-08-04 ENCOUNTER — Telehealth: Payer: Self-pay | Admitting: *Deleted

## 2020-08-04 ENCOUNTER — Other Ambulatory Visit: Payer: Self-pay

## 2020-08-04 ENCOUNTER — Encounter: Payer: Self-pay | Admitting: Internal Medicine

## 2020-08-04 VITALS — BP 118/72 | HR 106 | Ht 67.0 in | Wt 183.6 lb

## 2020-08-04 DIAGNOSIS — R42 Dizziness and giddiness: Secondary | ICD-10-CM

## 2020-08-04 DIAGNOSIS — R001 Bradycardia, unspecified: Secondary | ICD-10-CM

## 2020-08-04 DIAGNOSIS — G4730 Sleep apnea, unspecified: Secondary | ICD-10-CM

## 2020-08-04 NOTE — Telephone Encounter (Signed)
-----   Message from Alois Cliche, RN sent at 08/04/2020  3:31 PM EDT ----- Regarding: Sleep study Please precert and schedule pt for sleep study  Thank You,  Mindi Junker R,RN

## 2020-08-04 NOTE — Progress Notes (Signed)
Patient Care Team: Sigmund Hazel, MD as PCP - General (Family Medicine)   HPI  Christopher Gordon is a 65 y.o. male Seen in follow-up for episodic fatigue and dizziness in the context of but not clearly associated with abnormal monitoring demonstrated abrupt changes in heart rate   He continues to have spells of fatigue.  Review of his monitoring (again) demonstrates sinus bradycardia interrupted by frequent runs of nonsustained supraventricular tachycardia or atrial ectopy with documented sinus rates ranging from 50-125 And changes in rate that are abrupt but not at multiple of the PP interval  He takes methylphenidate.  This has helped with fatigue in the past.  No significant lightheadedness.  Records and Results Reviewed   Past Medical History:  Diagnosis Date  . ADD (attention deficit disorder)   . Allergic rhinitis   . Arthritis   . C. difficile colitis   . Carpal tunnel syndrome   . Diverticulitis   . ED (erectile dysfunction)   . GERD (gastroesophageal reflux disease)   . Hemorrhoids   . History of kidney stones   . Hyperlipidemia   . Hypertension   . Irregular heart beat   . Kidney lesion    Left side  . Kidney stone   . Left ureteral calculus   . Low testosterone   . Nephrolithiasis    right side calculi  . Wears glasses   . Wears partial dentures    UPPER AND LOWER    Past Surgical History:  Procedure Laterality Date  . COLONOSCOPY    . CYSTOSCOPY W/ URETERAL STENT PLACEMENT Right 04/24/2019   Procedure: CYSTOSCOPY WITH RETROGRADE PYELOGRAM/URETERAL STENT PLACEMENT;  Surgeon: Marcine Matar, MD;  Location: [facility];  Service: Urology;  Laterality: Right;  . CYSTOSCOPY WITH STENT PLACEMENT Left 07/31/2014   Procedure: CYSTOSCOPY WITH STENT PLACEMENT;  Surgeon: Bjorn Pippin, MD;  Location: [facility];  Service: Urology;  Laterality: Left;  . CYSTOSCOPY/RETROGRADE/URETEROSCOPY/STONE EXTRACTION WITH BASKET Right  05/07/2019   Procedure: CYSTOSCOPY/URETEROSCOPY/BASKET EXTRACTION/STENT EXCHANGE;  Surgeon: Bjorn Pippin, MD;  Location[facility];  Service: Urology;  Laterality: Right;  . EXTRACORPOREAL SHOCK WAVE LITHOTRIPSY Right 04/22/2019   Procedure: EXTRACORPOREAL SHOCK WAVE LITHOTRIPSY (ESWL);  Surgeon: Crista Elliot, MD;  Location: [facility];  Service: Urology;  Laterality: Right;  . KNEE SURGERY Right 04/2018  . 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 SHOULDER OPEN ROTATOR CUFF REPAIR Left 2011    Current Meds  Medication Sig  . aspirin EC 81 MG tablet Take 81 mg by mouth daily. Not currently taking  . atorvastatin (LIPITOR) 40 MG tablet TAKE 1 TABLET DAILY (PLEASE CALL OFFICE AND SCHEDULE OVERDUE YEARLY APPOINTMENT FOR FURTHER REFILLS. SECOND ATTEMPT)  . B Complex-Folic Acid (B COMPLEX PLUS PO) Take 1 tablet by mouth daily.  . Cholecalciferol (VITAMIN D3) 1.25 MG (50000 UT) TABS Take 1 tablet by mouth daily.  . Coenzyme Q10 (CO Q-10) 100 MG CAPS Take 1 tablet by mouth daily.  Marland Kitchen escitalopram (LEXAPRO) 10 MG tablet Take 10 mg by mouth daily.  Marland Kitchen losartan (COZAAR) 100 MG tablet Take 100 mg by mouth daily.  . meloxicam (MOBIC) 15 MG tablet Take 15 mg by mouth as needed.   . methylphenidate (RITALIN) 10 MG tablet Take 10 mg by mouth 2 (two) times daily with breakfast and lunch.   Marland Kitchen omeprazole (PRILOSEC) 20 MG capsule Take 20 mg by mouth 3 times/day as needed-between  meals & bedtime (HEARTBURN).  Marland Kitchen ondansetron (ZOFRAN ODT) 8 MG disintegrating tablet Take 1 tablet (8 mg total) by mouth every 8 (eight) hours as needed for nausea or vomiting.  Marland Kitchen oxybutynin (DITROPAN) 5 MG tablet Take 1 tablet (5 mg total) by mouth every 8 (eight) hours as needed for up to 15 doses for bladder spasms.  . pantoprazole (PROTONIX) 40 MG tablet Take 40 mg by mouth daily.  . potassium citrate (UROCIT-K) 10 MEQ (1080 MG) SR tablet Take 10 mEq by mouth 2 (two) times daily.  . Potassium  Citrate 15 MEQ (1620 MG) TBCR Take 1 tablet by mouth in the morning and at bedtime.  . sulfamethoxazole-trimethoprim (BACTRIM DS) 800-160 MG tablet Take 1 tablet by mouth 2 (two) times daily.  . [DISCONTINUED] losartan (COZAAR) 50 MG tablet Take 50 mg by mouth daily.     Allergies  Allergen Reactions  . Crestor [Rosuvastatin] Other (See Comments)    "body aches"  . Demerol [Meperidine] Nausea And Vomiting      Review of Systems negative except from HPI and PMH  Physical Exam BP 118/72   Pulse (!) 106   Ht 5\' 7"  (1.702 m)   Wt 183 lb 9.6 oz (83.3 kg)   SpO2 95%   BMI 28.76 kg/m  Well developed and well nourished in no acute distress HENT normal E scleral and icterus clear Neck Supple JVP flat; carotids brisk and full Clear to ausculation regular rate and rhythm, no murmurs gallops or rub Soft with active bowel sounds No clubbing cyanosis  Edema Alert and oriented, grossly normal motor and sensory function Skin Warm and Dry  ECG sinus at 106 Intervals 18/10/35  CrCl cannot be calculated (Patient's most recent lab result is older than the maximum 21 days allowed.).   Assessment and  Plan Fatigue/dizziness abrupt onset  Sleep disordered breathing  Sinus rates variable /abrupt HR changes  Atrial ectopy and couplets  Continues to have intermittent spells of weakness but I remain unconvinced that they are heart rate related-- they are only minimally symptomatic and so will follow for now, although we are in agreement that if they worsen reevaluation and/or pacing would be important   We will pursue again his sleep study that got canceled 2/2 covid    Current medicines are reviewed at length with the patient today .  The patient does not  have concerns regarding medicines.

## 2020-08-04 NOTE — Patient Instructions (Addendum)
Medication Instructions:  Your physician recommends that you continue on your current medications as directed. Please refer to the Current Medication list given to you today.  *If you need a refill on your cardiac medications before your next appointment, please call your pharmacy*   Lab Work: None ordered.  If you have labs (blood work) drawn today and your tests are completely normal, you will receive your results only by: Marland Kitchen MyChart Message (if you have MyChart) OR . A paper copy in the mail If you have any lab test that is abnormal or we need to change your treatment, we will call you to review the results.   Testing/Procedures:  Someone will call you to schedule once you have been approved by insurance. Your physician has recommended that you have a sleep study. This test records several body functions during sleep, including: brain activity, eye movement, oxygen and carbon dioxide blood levels, heart rate and rhythm, breathing rate and rhythm, the flow of air through your mouth and nose, snoring, body muscle movements, and chest and belly movement.   Follow-Up: At [facility], you and your health needs are our priority.  As part of our continuing mission to provide you with exceptional heart care, we have created designated Provider Care Teams.  These Care Teams include your primary Cardiologist (physician) and Advanced Practice Providers (APPs -  Physician Assistants and Nurse Practitioners) who all work together to provide you with the care you need, when you need it.  We recommend signing up for the patient portal called "MyChart".  Sign up information is provided on this After Visit Summary.  MyChart is used to connect with patients for Virtual Visits (Telemedicine).  Patients are able to view lab/test results, encounter notes, upcoming appointments, etc.  Non-urgent messages can be sent to your provider as well.   To learn more about what you can do with MyChart, go to  [email or web address].    Your next appointment:   6 month(s)  The format for your next appointment:   In Person  Provider:   Sherryl Manges, MD

## 2020-08-06 ENCOUNTER — Telehealth: Payer: Self-pay | Admitting: *Deleted

## 2020-08-06 NOTE — Telephone Encounter (Signed)
Left sleep study appointment details on VM. 

## 2020-08-17 DIAGNOSIS — F411 Generalized anxiety disorder: Secondary | ICD-10-CM | POA: Diagnosis not present

## 2020-08-17 DIAGNOSIS — K219 Gastro-esophageal reflux disease without esophagitis: Secondary | ICD-10-CM | POA: Diagnosis not present

## 2020-08-17 DIAGNOSIS — F900 Attention-deficit hyperactivity disorder, predominantly inattentive type: Secondary | ICD-10-CM | POA: Diagnosis not present

## 2020-08-17 DIAGNOSIS — E7800 Pure hypercholesterolemia, unspecified: Secondary | ICD-10-CM | POA: Diagnosis not present

## 2020-10-02 ENCOUNTER — Ambulatory Visit (HOSPITAL_BASED_OUTPATIENT_CLINIC_OR_DEPARTMENT_OTHER): Payer: BC Managed Care – PPO | Admitting: Cardiovascular Disease

## 2020-10-23 ENCOUNTER — Ambulatory Visit (HOSPITAL_BASED_OUTPATIENT_CLINIC_OR_DEPARTMENT_OTHER): Payer: BC Managed Care – PPO | Attending: Internal Medicine | Admitting: Cardiovascular Disease

## 2020-10-23 ENCOUNTER — Other Ambulatory Visit: Payer: Self-pay

## 2020-10-23 DIAGNOSIS — R0902 Hypoxemia: Secondary | ICD-10-CM | POA: Insufficient documentation

## 2020-10-23 DIAGNOSIS — Z7901 Long term (current) use of anticoagulants: Secondary | ICD-10-CM | POA: Diagnosis not present

## 2020-10-23 DIAGNOSIS — Z7982 Long term (current) use of aspirin: Secondary | ICD-10-CM | POA: Insufficient documentation

## 2020-10-23 DIAGNOSIS — G4733 Obstructive sleep apnea (adult) (pediatric): Secondary | ICD-10-CM | POA: Diagnosis not present

## 2020-10-23 DIAGNOSIS — G4730 Sleep apnea, unspecified: Secondary | ICD-10-CM

## 2020-10-23 DIAGNOSIS — I493 Ventricular premature depolarization: Secondary | ICD-10-CM | POA: Diagnosis not present

## 2020-10-26 ENCOUNTER — Other Ambulatory Visit: Payer: Self-pay

## 2020-11-03 ENCOUNTER — Encounter (HOSPITAL_BASED_OUTPATIENT_CLINIC_OR_DEPARTMENT_OTHER): Payer: Self-pay | Admitting: Cardiovascular Disease

## 2020-11-03 NOTE — Procedures (Signed)
Patient Name: Christopher Gordon, Graefe Date: 10/23/2020 Gender: Male D.O.B: [birth year] Age (years): 64 Referring Provider: Sherryl Manges Height (inches): 67 Interpreting Physician: Nicki Guadalajara MD, ABSM Weight (lbs): 180 RPSGT: Cherylann Parr BMI: 28 MRN: [id number] Neck Size: 16.00  CLINICAL INFORMATION Sleep Study Type: NPSG  Indication for sleep study: Snoring, Witnesses Apnea / Gasping During Sleep  Epworth Sleepiness Score: 7  SLEEP STUDY TECHNIQUE As per the AASM Manual for the Scoring of Sleep and Associated Events v2.3 (April 2016) with a hypopnea requiring 4% desaturations.  The channels recorded and monitored were frontal, central and occipital EEG, electrooculogram (EOG), submentalis EMG (chin), nasal and oral airflow, thoracic and abdominal wall motion, anterior tibialis EMG, snore microphone, electrocardiogram, and pulse oximetry.  MEDICATIONS aspirin EC 81 MG tablet atorvastatin (LIPITOR) 40 MG tablet B Complex-Folic Acid (B COMPLEX PLUS PO) Cholecalciferol (VITAMIN D3) 1.25 MG (50000 UT) TABS Coenzyme Q10 (CO Q-10) 100 MG CAPS escitalopram (LEXAPRO) 10 MG tablet losartan (COZAAR) 100 MG tablet meloxicam (MOBIC) 15 MG tablet methylphenidate (RITALIN) 10 MG tablet omeprazole (PRILOSEC) 20 MG capsule ondansetron (ZOFRAN ODT) 8 MG disintegrating tablet oxybutynin (DITROPAN) 5 MG tablet pantoprazole (PROTONIX) 40 MG tablet potassium citrate (UROCIT-K) 10 MEQ (1080 MG) SR tablet Potassium Citrate 15 MEQ (1620 MG) TBCR sulfamethoxazole-trimethoprim (BACTRIM DS) 800-160 MG tablet Medications self-administered by patient taken the night of the study : MELATONIN  SLEEP ARCHITECTURE The study was initiated at 10:47:03 PM and ended at 4:47:51 AM.  Sleep onset time was 49.1 minutes and the sleep efficiency was 78.5%%. The total sleep time was 283.3 minutes.  Stage REM latency was 10.0 minutes.  The patient spent 1.6%% of the night in stage N1 sleep, 63.8%% in  stage N2 sleep, 0.0%% in stage N3 and 34.6% in REM.  Alpha intrusion was absent.  Supine sleep was 74.35%.  RESPIRATORY PARAMETERS The overall apnea/hypopnea index (AHI) was 17.4 per hour. The respiratory disturbance index (RDI) was 19.1/h.There were 13 total apneas, including 13 obstructive, 0 central and 0 mixed apneas. There were 69 hypopneas and 8 RERAs.  The AHI during Stage REM sleep was 31.8 per hour.  AHI while supine was 17.7 per hour.  The mean oxygen saturation was 94.2%. The minimum SpO2 during sleep was 79.0%.  Moderate snoring was noted during this study.  CARDIAC DATA The 2 lead EKG demonstrated sinus rhythm. The mean heart rate was 48.4 beats per minute. Other EKG findings include: PVCs.  LEG MOVEMENT DATA The total PLMS were 0 with a resulting PLMS index of 0.0. Associated arousal with leg movement index was 0.0 .  IMPRESSIONS - Moderate obstructive sleep apnea overall (AHI 17.4/h; RDI 19.1/h). Events were severe during REM sleep (AHI 31.8/h). - Significant oxygen desaturation to a nadir of  79.0%. - The patient snored with moderate snoring volume. - EKG findings include PVCs. - Clinically significant periodic limb movements did not occur during sleep. No significant associated arousals.  DIAGNOSIS - Obstructive Sleep Apnea (G47.33) - Nocturnal Hypoxemia (G47.36)  RECOMMENDATIONS - Therapeutic CPAP titration to determine optimal pressure required to alleviate sleep disordered breathing. - Effort should be made to optimize nasal and oropharyngeal patency. - Avoid alcohol, sedatives and other CNS depressants that may worsen sleep apnea and disrupt normal sleep architecture. - Sleep hygiene should be reviewed to assess factors that may improve sleep quality. - Weight management and regular exercise should be initiated or continued if appropriate.  [Electronically signed] 11/03/2020 08:31 AM  Nicki Guadalajara MD, [facility], ABSM Diplomate, American  Board of Sleep  Medicine   NPI: [id number] [facility] PH: [phone]   FX: [phone] ACCREDITED BY THE AMERICAN ACADEMY OF SLEEP MEDICINE

## 2020-11-13 ENCOUNTER — Telehealth: Payer: Self-pay | Admitting: *Deleted

## 2020-11-13 NOTE — Telephone Encounter (Signed)
-----   Message from Lennette Bihari, MD sent at 11/03/2020  8:37 AM EDT ----- Burna Mortimer please notify pt and set up for CPAP titration study.

## 2020-11-13 NOTE — Telephone Encounter (Signed)
Left message to return a call to discuss sleep study results and recommendations. 

## 2020-11-16 ENCOUNTER — Other Ambulatory Visit: Payer: Self-pay | Admitting: Cardiovascular Disease

## 2020-11-16 DIAGNOSIS — G4733 Obstructive sleep apnea (adult) (pediatric): Secondary | ICD-10-CM

## 2020-11-16 DIAGNOSIS — G4736 Sleep related hypoventilation in conditions classified elsewhere: Secondary | ICD-10-CM

## 2020-11-30 NOTE — Telephone Encounter (Signed)
My chart message sent to patient asking him to call me to discuss his sleep study results and recommendations.

## 2020-12-01 ENCOUNTER — Telehealth: Payer: Self-pay | Admitting: *Deleted

## 2020-12-01 NOTE — Telephone Encounter (Signed)
-----   Message from Thomas A Kelly, MD sent at 11/03/2020  8:37 AM EDT ----- Kafi Dotter please notify pt and set up for CPAP titration study. 

## 2020-12-01 NOTE — Telephone Encounter (Signed)
Patient returned a call to me and was given his sleep study results and recommendations. He states that he has to pay approximately $1500 out of his pocket for the NSPG. He wants to call his insurance company to investigate how much this test will be. Patient asked for me to email him the CPT code and diagnosis codes. He will contact me back if he wants to proceed.

## 2020-12-17 DIAGNOSIS — Z79899 Other long term (current) drug therapy: Secondary | ICD-10-CM | POA: Diagnosis not present

## 2020-12-17 DIAGNOSIS — Z0000 Encounter for general adult medical examination without abnormal findings: Secondary | ICD-10-CM | POA: Diagnosis not present

## 2020-12-17 DIAGNOSIS — K219 Gastro-esophageal reflux disease without esophagitis: Secondary | ICD-10-CM | POA: Diagnosis not present

## 2020-12-17 DIAGNOSIS — E7800 Pure hypercholesterolemia, unspecified: Secondary | ICD-10-CM | POA: Diagnosis not present

## 2020-12-17 DIAGNOSIS — I10 Essential (primary) hypertension: Secondary | ICD-10-CM | POA: Diagnosis not present

## 2020-12-17 DIAGNOSIS — F900 Attention-deficit hyperactivity disorder, predominantly inattentive type: Secondary | ICD-10-CM | POA: Diagnosis not present

## 2020-12-18 ENCOUNTER — Telehealth: Payer: Self-pay | Admitting: *Deleted

## 2020-12-18 NOTE — Telephone Encounter (Signed)
Prior Authorization for CPAP titration sent to [facility] via Fax @ [phone].

## 2020-12-18 NOTE — Telephone Encounter (Signed)
Patient called in to say that he has decided to proceed with having the CPAP titration.

## 2020-12-22 NOTE — Telephone Encounter (Signed)
Per fax received no PA is required. Case reference # [id number]. Left appointment details on patient's voicemail.

## 2021-02-02 DIAGNOSIS — J22 Unspecified acute lower respiratory infection: Secondary | ICD-10-CM | POA: Diagnosis not present

## 2021-02-17 ENCOUNTER — Ambulatory Visit (HOSPITAL_BASED_OUTPATIENT_CLINIC_OR_DEPARTMENT_OTHER): Payer: BC Managed Care – PPO | Attending: Cardiovascular Disease | Admitting: Cardiovascular Disease

## 2021-02-17 ENCOUNTER — Other Ambulatory Visit: Payer: Self-pay

## 2021-02-17 DIAGNOSIS — G4733 Obstructive sleep apnea (adult) (pediatric): Secondary | ICD-10-CM | POA: Insufficient documentation

## 2021-02-17 DIAGNOSIS — N281 Cyst of kidney, acquired: Secondary | ICD-10-CM | POA: Diagnosis not present

## 2021-02-17 DIAGNOSIS — R3915 Urgency of urination: Secondary | ICD-10-CM | POA: Diagnosis not present

## 2021-02-17 DIAGNOSIS — R350 Frequency of micturition: Secondary | ICD-10-CM | POA: Diagnosis not present

## 2021-02-17 DIAGNOSIS — N200 Calculus of kidney: Secondary | ICD-10-CM | POA: Diagnosis not present

## 2021-02-26 ENCOUNTER — Other Ambulatory Visit: Payer: Self-pay | Admitting: Urology

## 2021-02-26 DIAGNOSIS — R932 Abnormal findings on diagnostic imaging of liver and biliary tract: Secondary | ICD-10-CM

## 2021-02-26 DIAGNOSIS — N200 Calculus of kidney: Secondary | ICD-10-CM

## 2021-03-04 ENCOUNTER — Other Ambulatory Visit: Payer: Self-pay

## 2021-03-04 ENCOUNTER — Encounter: Payer: Self-pay | Admitting: Internal Medicine

## 2021-03-04 ENCOUNTER — Ambulatory Visit (INDEPENDENT_AMBULATORY_CARE_PROVIDER_SITE_OTHER): Payer: BC Managed Care – PPO | Admitting: Internal Medicine

## 2021-03-04 VITALS — BP 140/72 | HR 54 | Ht 66.5 in | Wt 189.0 lb

## 2021-03-04 DIAGNOSIS — G4733 Obstructive sleep apnea (adult) (pediatric): Secondary | ICD-10-CM

## 2021-03-04 DIAGNOSIS — R42 Dizziness and giddiness: Secondary | ICD-10-CM | POA: Diagnosis not present

## 2021-03-04 MED ORDER — BISOPROLOL FUMARATE 5 MG PO TABS: 2.5000 mg | ORAL_TABLET | Freq: Every day | ORAL | 3 refills | Status: DC

## 2021-03-04 NOTE — Progress Notes (Signed)
Patient Care Team: Sigmund Hazel, MD as PCP - General (Family Medicine)   HPI  Christopher Gordon is a 65 y.o. male Seen in follow-up for episodic fatigue and dizziness in the context of but not clearly associated with abnormal monitoring demonstrated abrupt changes in heart rate   Overall has been doing better.  Episodic blood pressure variations that are controlled from the 130s-150s.  Recently amlodipine was begun and up titration from 5-10 associated with the development of edema  Has noted no change in his blood pressure taking methylphenidate  No chest pain shortness of breath or lightheadedness  Review of his monitoring (again) demonstrates sinus bradycardia interrupted by frequent runs of nonsustained supraventricular tachycardia or atrial ectopy with documented sinus rates ranging from 50-125 And changes in rate that are abrupt but not at multiples of the PP interval (that might suggest sinus exit block)  Recent sleep study is positive and he has been fitted for CPAP    Records and Results Reviewed   Past Medical History:  Diagnosis Date   ADD (attention deficit disorder)    Allergic rhinitis    Arthritis    C. difficile colitis    Carpal tunnel syndrome    Diverticulitis    ED (erectile dysfunction)    GERD (gastroesophageal reflux disease)    Hemorrhoids    History of kidney stones    Hyperlipidemia    Hypertension    Irregular heart beat    Kidney lesion    Left side   Kidney stone    Left ureteral calculus    Low testosterone    Nephrolithiasis    right side calculi   Wears glasses    Wears partial dentures    UPPER AND LOWER    Past Surgical History:  Procedure Laterality Date   COLONOSCOPY     CYSTOSCOPY W/ URETERAL STENT PLACEMENT Right 04/24/2019   Procedure: CYSTOSCOPY WITH RETROGRADE PYELOGRAM/URETERAL STENT PLACEMENT;  Surgeon: Marcine Matar, MD;  Location: [facility];  Service: Urology;  Laterality: Right;    CYSTOSCOPY WITH STENT PLACEMENT Left 07/31/2014   Procedure: CYSTOSCOPY WITH STENT PLACEMENT;  Surgeon: Bjorn Pippin, MD;  Location: [facility];  Service: Urology;  Laterality: Left;   CYSTOSCOPY/RETROGRADE/URETEROSCOPY/STONE EXTRACTION WITH BASKET Right 05/07/2019   Procedure: CYSTOSCOPY/URETEROSCOPY/BASKET EXTRACTION/STENT EXCHANGE;  Surgeon: Bjorn Pippin, MD;  Location: [facility];  Service: Urology;  Laterality: Right;   EXTRACORPOREAL SHOCK WAVE LITHOTRIPSY Right 04/22/2019   Procedure: EXTRACORPOREAL SHOCK WAVE LITHOTRIPSY (ESWL);  Surgeon: Crista Elliot, MD;  Location: [facility];  Service: Urology;  Laterality: Right;   KNEE SURGERY Right 04/2018   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   aspirin EC 81 MG tablet Take 81 mg by mouth daily. Not currently taking   atorvastatin (LIPITOR) 40 MG tablet TAKE 1 TABLET DAILY (PLEASE CALL OFFICE AND SCHEDULE OVERDUE YEARLY APPOINTMENT FOR FURTHER REFILLS. SECOND ATTEMPT)   B Complex-Folic Acid (B COMPLEX PLUS PO) Take 1 tablet by mouth daily.   Cholecalciferol (VITAMIN D3) 1.25 MG (50000 UT) TABS Take 1 tablet by mouth daily.   Coenzyme Q10 (CO Q-10) 100 MG CAPS Take 4 tablets by mouth daily.   escitalopram (LEXAPRO) 10 MG tablet Take 10 mg by mouth daily.  losartan (COZAAR) 100 MG tablet Take 100 mg by mouth daily.   magnesium gluconate (MAGONATE) 500 MG tablet Take 500 mg by mouth 2 (two) times daily.   meloxicam (MOBIC) 15 MG tablet Take 15 mg by mouth as needed.    methylphenidate (RITALIN) 10 MG tablet Take 10 mg by mouth 2 (two) times daily with breakfast and lunch.    ondansetron (ZOFRAN ODT) 8 MG disintegrating tablet Take 1 tablet (8 mg total) by mouth every 8 (eight) hours as needed for nausea or vomiting.   oxybutynin (DITROPAN) 5 MG tablet  Take 1 tablet (5 mg total) by mouth every 8 (eight) hours as needed for up to 15 doses for bladder spasms.   pantoprazole (PROTONIX) 40 MG tablet Take 40 mg by mouth daily.   Potassium Citrate 15 MEQ (1620 MG) TBCR Take 1 tablet by mouth in the morning and at bedtime.   Turmeric (QC TUMERIC COMPLEX) 500 MG CAPS Take by mouth.    Allergies  Allergen Reactions   Crestor [Rosuvastatin] Other (See Comments)    "body aches"   Demerol [Meperidine] Nausea And Vomiting      Review of Systems negative except from HPI and PMH  Physical Exam BP 140/72   Pulse (!) 54   Ht 5' 6.5" (1.689 m)   Wt 189 lb (85.7 kg)   SpO2 97%   BMI 30.05 kg/m    Well developed and nourished in no acute distress HENT normal Neck supple with JVP-  flat   Clear Regular rate and rhythm, no murmurs or gallops Abd-soft with active BS No Clubbing cyanosis 1+ edema Skin-warm and dry A & Oriented  Grossly normal sensory and motor function  ECG sinus at 54 Interval 16/10/40  CrCl cannot be calculated (Patient's most recent lab result is older than the maximum 21 days allowed.).   Assessment and  Plan Fatigue/dizziness abrupt onset   Sleep disordered breathing  Sinus rates variable /abrupt HR changes  High blood pressure   Atrial ectopy and couplets   Blood pressure is elevated, I suspect the edema, given the temporal relation, it is related to the amlodipine.  We will decrease it from 10--5 mg.  He takes potassium citrate for renal stones and so using aldosterone antagonists I think may be problematic.  Rather than broach that at this point we will try a adjunctive beta-blocker, bisoprolol as a 2.5 mg daily, reviewed side effects.  This may also help with his palpitations; hopefully will not aggravate bradycardia    Current medicines are reviewed at length with the patient today .  The patient does not  have concerns regarding medicines.

## 2021-03-04 NOTE — Patient Instructions (Addendum)
Medication Instructions:  Your physician has recommended you make the following change in your medication:   Decrease Amlodipine from 10mg  to 5 mg - 1/2 tablet by mouth daily  Begin Bisoprolol 5mg  - 1/2 (2.5mg ) tablet by mouth daily.   *If you need a refill on your cardiac medications before your next appointment, please call your pharmacy*   Lab Work: None ordered.  If you have labs (blood work) drawn today and your tests are completely normal, you will receive your results only by: MyChart Message (if you have MyChart) OR A paper copy in the mail If you have any lab test that is abnormal or we need to change your treatment, we will call you to review the results.   Testing/Procedures: None ordered.    Follow-Up: At [facility], you and your health needs are our priority.  As part of our continuing mission to provide you with exceptional heart care, we have created designated Provider Care Teams.  These Care Teams include your primary Cardiologist (physician) and Advanced Practice Providers (APPs -  Physician Assistants and Nurse Practitioners) who all work together to provide you with the care you need, when you need it.  We recommend signing up for the patient portal called "MyChart".  Sign up information is provided on this After Visit Summary.  MyChart is used to connect with patients for Virtual Visits (Telemedicine).  Patients are able to view lab/test results, encounter notes, upcoming appointments, etc.  Non-urgent messages can be sent to your provider as well.   To learn more about what you can do with MyChart, go to .    Your next appointment:   Follow up with Dr [facility] in 12 months  Please follow up with your PCP re: blood pressure

## 2021-03-09 DIAGNOSIS — M65331 Trigger finger, right middle finger: Secondary | ICD-10-CM | POA: Diagnosis not present

## 2021-03-09 DIAGNOSIS — M65321 Trigger finger, right index finger: Secondary | ICD-10-CM | POA: Diagnosis not present

## 2021-03-09 DIAGNOSIS — M65332 Trigger finger, left middle finger: Secondary | ICD-10-CM | POA: Diagnosis not present

## 2021-03-09 DIAGNOSIS — M65322 Trigger finger, left index finger: Secondary | ICD-10-CM | POA: Diagnosis not present

## 2021-03-11 ENCOUNTER — Ambulatory Visit
Admission: RE | Admit: 2021-03-11 | Discharge: 2021-03-11 | Disposition: A | Discharge status: Home / Self Care | Payer: BC Managed Care – PPO | Source: Ambulatory Visit | Attending: Urology | Admitting: Urology

## 2021-03-11 ENCOUNTER — Other Ambulatory Visit: Payer: Self-pay

## 2021-03-11 ENCOUNTER — Other Ambulatory Visit: Payer: Self-pay | Admitting: Orthopedic Surgery

## 2021-03-11 DIAGNOSIS — K5730 Diverticulosis of large intestine without perforation or abscess without bleeding: Secondary | ICD-10-CM | POA: Diagnosis not present

## 2021-03-11 DIAGNOSIS — D1803 Hemangioma of intra-abdominal structures: Secondary | ICD-10-CM | POA: Diagnosis not present

## 2021-03-11 DIAGNOSIS — N281 Cyst of kidney, acquired: Secondary | ICD-10-CM | POA: Diagnosis not present

## 2021-03-11 DIAGNOSIS — R932 Abnormal findings on diagnostic imaging of liver and biliary tract: Secondary | ICD-10-CM

## 2021-03-11 DIAGNOSIS — K7689 Other specified diseases of liver: Secondary | ICD-10-CM | POA: Diagnosis not present

## 2021-03-11 DIAGNOSIS — N200 Calculus of kidney: Secondary | ICD-10-CM

## 2021-03-11 MED ORDER — GADOBENATE DIMEGLUMINE 529 MG/ML IV SOLN
17.0000 mL | Freq: Once | INTRAVENOUS | Status: AC | PRN
  Administered 2021-03-11: 17 mL via INTRAVENOUS

## 2021-03-12 ENCOUNTER — Encounter (HOSPITAL_BASED_OUTPATIENT_CLINIC_OR_DEPARTMENT_OTHER): Payer: Self-pay | Admitting: Cardiovascular Disease

## 2021-03-12 NOTE — Procedures (Signed)
Patient Name: Christopher Gordon, Christopher Gordon Date: 02/17/2021 Gender: Male D.O.B: 02/28/1956 Age (years): 64 Referring Provider: Virl Axe Height (inches): 68 Interpreting Physician: Shelva Majestic MD, ABSM Weight (lbs): 183 RPSGT: Laren Everts BMI: 29 MRN: [id number] Neck Size: 14.50  CLINICAL INFORMATION The patient is referred for a CPAP titration to treat sleep apnea.  Date of NPSG:  10/23/2020: AHI 17.4/h, RDI 19.1/h; REM AHI 31.8/h, O2 nadir 79%  SLEEP STUDY TECHNIQUE As per the AASM Manual for the Scoring of Sleep and Associated Events v2.3 (April 2016) with a hypopnea requiring 4% desaturations.  The channels recorded and monitored were frontal, central and occipital EEG, electrooculogram (EOG), submentalis EMG (chin), nasal and oral airflow, thoracic and abdominal wall motion, anterior tibialis EMG, snore microphone, electrocardiogram, and pulse oximetry. Continuous positive airway pressure (CPAP) was initiated at the beginning of the study and titrated to treat sleep-disordered breathing.  MEDICATIONS amLODipine (NORVASC) 10 MG tablet aspirin EC 81 MG tablet atorvastatin (LIPITOR) 40 MG tablet B Complex-Folic Acid (B COMPLEX PLUS PO) bisoprolol (ZEBETA) 5 MG tablet Cholecalciferol (VITAMIN D3) 1.25 MG (50000 UT) TABS Coenzyme Q10 (CO Q-10) 100 MG CAPS escitalopram (LEXAPRO) 10 MG tablet losartan (COZAAR) 100 MG tablet magnesium gluconate (MAGONATE) 500 MG tablet meloxicam (MOBIC) 15 MG tablet methylphenidate (RITALIN) 10 MG tablet omeprazole (PRILOSEC) 20 MG capsule ondansetron (ZOFRAN ODT) 8 MG disintegrating tablet oxybutynin (DITROPAN) 5 MG tablet pantoprazole (PROTONIX) 40 MG tablet potassium citrate (UROCIT-K) 10 MEQ (1080 MG) SR tablet Potassium Citrate 15 MEQ (1620 MG) TBCR sulfamethoxazole-trimethoprim (BACTRIM DS) 800-160 MG tablet Turmeric (QC TUMERIC COMPLEX) 500 MG CAPS  Medications self-administered by patient taken the night of the study :  MELATONIN, POTASSIUM CITRATE, ATORVASTATIN  TECHNICIAN COMMENTS Comments added by technician: None Comments added by scorer: N/A  RESPIRATORY PARAMETERS Optimal PAP Pressure (cm): 8 AHI at Optimal Pressure (/hr): 0 Overall Minimal O2 (%): 87.0 Supine % at Optimal Pressure (%): 100 Minimal O2 at Optimal Pressure (%): 90.0   SLEEP ARCHITECTURE The study was initiated at 10:13:59 PM and ended at 4:43:00 AM.  Sleep onset time was 39.3 minutes and the sleep efficiency was 86.9%%. The total sleep time was 338 minutes.  The patient spent 3.4%% of the night in stage N1 sleep, 80.9%% in stage N2 sleep, 0.0%% in stage N3 and 15.7% in REM.Stage REM latency was 49.0 minutes  Wake after sleep onset was 11.7. Alpha intrusion was absent. Supine sleep was 76.57%.  CARDIAC DATA The 2 lead EKG demonstrated sinus rhythm. The mean heart rate was 46.3 beats per minute. Other EKG findings include: None.  LEG MOVEMENT DATA The total Periodic Limb Movements of Sleep (PLMS) were 0. The PLMS index was 0.0. A PLMS index of <15 is considered normal in adults.  IMPRESSIONS - CPAP was initiated at 5 cm and was titrated to optimal PAP pressure at 8 cm of water (AHI 0, O2 nadir 90% - Mild oxygen desaturations to a nadir of 87% at 5 cm. - The patient snored with soft snoring volume during this titration study. - Several nonsustained bursts of atrial ectopy were observed during this study.  - Clinically significant periodic limb movements were not noted during this study. Arousals associated with PLMs were rare.  DIAGNOSIS - Obstructive Sleep Apnea (G47.33)  RECOMMENDATIONS - Recommend an initial trial of CPAP Auto therapy with EPR of 3 at 7 - 12  cm H2O with heated humidification. A Small size Fisher&Paykel Full Face Mask F&P Vitera (new) mask was used for the  titration.  - Effort should be made to optimize nasal and oropharyngeal patency. - Avoid alcohol, sedatives and other CNS depressants that may worsen  sleep apnea and disrupt normal sleep architecture. - Sleep hygiene should be reviewed to assess factors that may improve sleep quality. - Weight management and regular exercise should be initiated or continued. - Recommend a download in 30 days and sleep clinic evaluation after 4 weeks of therapy.   [Electronically signed] 03/12/2021 12:15 PM  Nicki Guadalajara MD, [facility], ABSM Diplomate, American Board of Sleep Medicine   NPI: [id number]  [facility] PH: [phone]   FX: [phone] ACCREDITED BY THE AMERICAN ACADEMY OF SLEEP MEDICINE

## 2021-03-16 ENCOUNTER — Telehealth: Payer: Self-pay | Admitting: *Deleted

## 2021-03-16 NOTE — Telephone Encounter (Signed)
Left message sleep study has been completed. Order for CPAP machine will be sent to [facility]. Cal me back if questions and or concerns.

## 2021-03-16 NOTE — Telephone Encounter (Signed)
-----   Message from Lennette Bihari, MD sent at 03/12/2021 12:21 PM EDT ----- Burna Mortimer, please notify patient of the results.  Set up with DME company for CPAP initiation.

## 2021-03-18 DIAGNOSIS — H903 Sensorineural hearing loss, bilateral: Secondary | ICD-10-CM | POA: Diagnosis not present

## 2021-03-19 ENCOUNTER — Telehealth: Payer: Self-pay | Admitting: *Deleted

## 2021-03-19 NOTE — Telephone Encounter (Signed)
    Patient Name: Christopher Gordon  DOB: Mar 02, 1956 MRN: [id number]  Primary Cardiologist: dr. Graciela Husbands  Chart reviewed as part of pre-operative protocol coverage.   Dr. Graciela Husbands, you recently saw the patient 03/04/21 for abrupt onset fatigue and dizziness with variable HR.   Please give your recommendations about clearance and ASA.   Please forward your response to P CV DIV PREOP.   Thank you    Manson Passey, PA 03/19/2021, 1:53 PM

## 2021-03-19 NOTE — Telephone Encounter (Signed)
   Pre-operative Risk Assessment    Patient Name: Christopher Gordon  DOB: Feb 28, 1956 MRN: [id number]      Request for Surgical Clearance   Procedure:   RIGHT LONG AND INDEX FINGER TRIGGER RELEASES  Date of Surgery: Clearance 04/09/21                                 Surgeon:  DR. Milly Jakob Surgeon's Group or Practice Name: [facility] Phone number: [phone]  Fax number:  [phone] ATTN: Lattie Corns   Type of Clearance Requested: - Medical  - Pharmacy:  Hold Aspirin     Type of Anesthesia:   MAC   Additional requests/questions:   Jiles Prows   03/19/2021, 11:23 AM

## 2021-03-22 DIAGNOSIS — H903 Sensorineural hearing loss, bilateral: Secondary | ICD-10-CM | POA: Diagnosis not present

## 2021-03-30 NOTE — Telephone Encounter (Signed)
Hi Dr. Graciela Husbands. Please see Vin's note below.   Thank you!

## 2021-04-07 NOTE — Telephone Encounter (Signed)
    Patient Name: Christopher Gordon  DOB: 05/05/1956 MRN: [id number]  Primary Cardiologist: None  Chart reviewed as part of pre-operative protocol coverage. Patient was recently seen by Dr. Graciela Husbands on 03/04/2021 at which time he was stable from a cardiac standpoint. Per Dr. Graciela Husbands, patient should be at acceptable risk for orthopedic surgery. OK to hold Aspirin as needed prior to procedure. Please restart this as soon as safely possible afterwards.   I will route this recommendation to the requesting party via Epic fax function and remove from pre-op pool.  Please call with questions.  Corrin Parker, PA-C 04/07/2021, 2:53 PM

## 2021-04-07 NOTE — Telephone Encounter (Signed)
Can hold ASA for surgery as need per ortho Should be acceptable risk for orthopedic surgery

## 2021-04-10 DIAGNOSIS — R509 Fever, unspecified: Secondary | ICD-10-CM | POA: Diagnosis not present

## 2021-04-10 DIAGNOSIS — J069 Acute upper respiratory infection, unspecified: Secondary | ICD-10-CM | POA: Diagnosis not present

## 2021-04-16 DIAGNOSIS — G4733 Obstructive sleep apnea (adult) (pediatric): Secondary | ICD-10-CM | POA: Diagnosis not present

## 2021-04-20 ENCOUNTER — Telehealth: Payer: Self-pay | Admitting: *Deleted

## 2021-04-20 NOTE — Telephone Encounter (Signed)
Staff message sent to [facility] patient was set up on CPAP 04/16/21. Please schedule his compliance visit with Dr Tresa Endo by 07/15/21.

## 2021-05-07 DIAGNOSIS — G4733 Obstructive sleep apnea (adult) (pediatric): Secondary | ICD-10-CM | POA: Diagnosis not present

## 2021-05-17 DIAGNOSIS — G4733 Obstructive sleep apnea (adult) (pediatric): Secondary | ICD-10-CM | POA: Diagnosis not present

## 2021-05-24 ENCOUNTER — Other Ambulatory Visit: Payer: Self-pay

## 2021-05-24 MED ORDER — BISOPROLOL FUMARATE 5 MG PO TABS: 2.5000 mg | ORAL_TABLET | Freq: Every day | ORAL | 2 refills | Status: DC

## 2021-06-17 DIAGNOSIS — G4733 Obstructive sleep apnea (adult) (pediatric): Secondary | ICD-10-CM | POA: Diagnosis not present

## 2021-07-15 DIAGNOSIS — G4733 Obstructive sleep apnea (adult) (pediatric): Secondary | ICD-10-CM | POA: Diagnosis not present

## 2021-07-19 ENCOUNTER — Encounter: Payer: Self-pay | Admitting: Cardiovascular Disease

## 2021-07-19 ENCOUNTER — Ambulatory Visit (INDEPENDENT_AMBULATORY_CARE_PROVIDER_SITE_OTHER): Payer: BC Managed Care – PPO | Admitting: Cardiovascular Disease

## 2021-07-19 ENCOUNTER — Other Ambulatory Visit: Payer: Self-pay

## 2021-07-19 VITALS — BP 114/58 | HR 42 | Ht 65.5 in | Wt 195.8 lb

## 2021-07-19 DIAGNOSIS — K219 Gastro-esophageal reflux disease without esophagitis: Secondary | ICD-10-CM | POA: Diagnosis not present

## 2021-07-19 DIAGNOSIS — E669 Obesity, unspecified: Secondary | ICD-10-CM | POA: Diagnosis not present

## 2021-07-19 DIAGNOSIS — G4733 Obstructive sleep apnea (adult) (pediatric): Secondary | ICD-10-CM

## 2021-07-19 DIAGNOSIS — I491 Atrial premature depolarization: Secondary | ICD-10-CM

## 2021-07-19 DIAGNOSIS — I10 Essential (primary) hypertension: Secondary | ICD-10-CM | POA: Diagnosis not present

## 2021-07-19 DIAGNOSIS — R001 Bradycardia, unspecified: Secondary | ICD-10-CM | POA: Diagnosis not present

## 2021-07-19 DIAGNOSIS — G4736 Sleep related hypoventilation in conditions classified elsewhere: Secondary | ICD-10-CM | POA: Diagnosis not present

## 2021-07-19 DIAGNOSIS — E7800 Pure hypercholesterolemia, unspecified: Secondary | ICD-10-CM | POA: Diagnosis not present

## 2021-07-19 NOTE — Patient Instructions (Signed)
Medication Instructions:  ?Your physician recommends that you continue on your current medications as directed. Please refer to the Current Medication list given to you today. ? ?*If you need a refill on your cardiac medications before your next appointment, please call your pharmacy* ? ? ?Follow-Up: ?At [facility], you and your health needs are our priority.  As part of our continuing mission to provide you with exceptional heart care, we have created designated Provider Care Teams.  These Care Teams include your primary Cardiologist (physician) and Advanced Practice Providers (APPs -  Physician Assistants and Nurse Practitioners) who all work together to provide you with the care you need, when you need it. ? ?We recommend signing up for the patient portal called "MyChart".  Sign up information is provided on this After Visit Summary.  MyChart is used to connect with patients for Virtual Visits (Telemedicine).  Patients are able to view lab/test results, encounter notes, upcoming appointments, etc.  Non-urgent messages can be sent to your provider as well.   ?To learn more about what you can do with MyChart, go to [email or web address].   ? ?Your next appointment:   ?12 month(s) ? ?The format for your next appointment:   ?In Person ? ?Provider:   ?Nicki Guadalajara, MD ? ?Other Instructions ?Sleep Clinic  ? ?ResMed air N30i  ?

## 2021-07-19 NOTE — Progress Notes (Unsigned)
Cardiology Office Note    Date:  07/19/2021   ID:  [name], [name] 03/27/1956, MRN [id number]  PCP:  Kathyrn Lass, MD  Cardiologist:  Shelva Majestic, MD (sleep); Dr. Caryl Comes  No chief complaint on file.   History of Present Illness:  Christopher Gordon is a 66 y.o. male ***    Past Medical History:  Diagnosis Date   ADD (attention deficit disorder)    Allergic rhinitis    Arthritis    C. difficile colitis    Carpal tunnel syndrome    Diverticulitis    ED (erectile dysfunction)    GERD (gastroesophageal reflux disease)    Hemorrhoids    History of kidney stones    Hyperlipidemia    Hypertension    Irregular heart beat    Kidney lesion    Left side   Kidney stone    Left ureteral calculus    Low testosterone    Nephrolithiasis    right side calculi   Wears glasses    Wears partial dentures    UPPER AND LOWER    Past Surgical History:  Procedure Laterality Date   COLONOSCOPY     CYSTOSCOPY W/ URETERAL STENT PLACEMENT Right 04/24/2019   Procedure: CYSTOSCOPY WITH RETROGRADE PYELOGRAM/URETERAL STENT PLACEMENT;  Surgeon: Franchot Gallo, MD;  Location: [facility];  Service: Urology;  Laterality: Right;   CYSTOSCOPY WITH STENT PLACEMENT Left 07/31/2014   Procedure: CYSTOSCOPY WITH STENT PLACEMENT;  Surgeon: Irine Seal, MD;  Location: [facility];  Service: Urology;  Laterality: Left;   CYSTOSCOPY/RETROGRADE/URETEROSCOPY/STONE EXTRACTION WITH BASKET Right 05/07/2019   Procedure: CYSTOSCOPY/URETEROSCOPY/BASKET EXTRACTION/STENT EXCHANGE;  Surgeon: Irine Seal, MD;  Location: [facility];  Service: Urology;  Laterality: Right;   EXTRACORPOREAL SHOCK WAVE LITHOTRIPSY Right 04/22/2019   Procedure: EXTRACORPOREAL SHOCK WAVE LITHOTRIPSY (ESWL);  Surgeon: Lucas Mallow, MD;  Location: [facility];  Service: Urology;  Laterality: Right;   KNEE SURGERY Right 04/2018   ORCHIOPEXY  as child   undescended testis    SHOULDER OPEN ROTATOR CUFF REPAIR Left 2011    Current Medications: Outpatient Medications Prior to Visit  Medication Sig Dispense Refill   amLODipine (NORVASC) 10 MG tablet Take 5 mg by mouth daily.     atorvastatin (LIPITOR) 40 MG tablet TAKE 1 TABLET DAILY (PLEASE CALL OFFICE AND SCHEDULE OVERDUE YEARLY APPOINTMENT FOR FURTHER REFILLS. SECOND ATTEMPT) 30 tablet 0   B Complex-Folic Acid (B COMPLEX PLUS PO) Take 1 tablet by mouth daily.     bisoprolol (ZEBETA) 5 MG tablet Take 0.5 tablets (2.5 mg total) by mouth daily. 45 tablet 2   Cholecalciferol (VITAMIN D3) 1.25 MG (50000 UT) TABS Take 1 tablet by mouth daily.     Coenzyme Q10 (CO Q-10) 100 MG CAPS Take 4 tablets by mouth daily.     losartan (COZAAR) 100 MG tablet Take 100 mg by mouth daily.     meloxicam (MOBIC) 15 MG tablet Take 15 mg by mouth as needed.      methylphenidate (RITALIN) 10 MG tablet Take 10 mg by mouth 2 (two) times daily with breakfast and lunch.      ondansetron (ZOFRAN ODT) 8 MG disintegrating tablet Take 1 tablet (8 mg total) by mouth every 8 (eight) hours as needed for nausea or vomiting. 20 tablet 0   oxybutynin (DITROPAN) 5 MG tablet Take 1 tablet (5 mg total) by mouth every 8 (eight) hours as needed for up to 15 doses  for bladder spasms. 45 tablet 1   pantoprazole (PROTONIX) 40 MG tablet Take 40 mg by mouth daily.     Potassium Citrate 15 MEQ (1620 MG) TBCR Take 1 tablet by mouth in the morning and at bedtime.     sulfamethoxazole-trimethoprim (BACTRIM DS) 800-160 MG tablet Take 1 tablet by mouth 2 (two) times daily. 10 tablet 0   aspirin EC 81 MG tablet Take 81 mg by mouth daily. Not currently taking     escitalopram (LEXAPRO) 10 MG tablet Take 10 mg by mouth daily.     magnesium gluconate (MAGONATE) 500 MG tablet Take 500 mg by mouth 2 (two) times daily.     omeprazole (PRILOSEC) 20 MG capsule Take 20 mg by mouth 3 times/day as needed-between meals & bedtime (HEARTBURN).     Turmeric 500 MG CAPS Take by mouth.      potassium citrate (UROCIT-K) 10 MEQ (1080 MG) SR tablet Take 10 mEq by mouth 2 (two) times daily. (Patient not taking: Reported on 03/04/2021)     No facility-administered medications prior to visit.     Allergies:   Crestor [rosuvastatin] and Demerol [meperidine]   Social History   Socioeconomic History   Marital status: Married    Spouse name: Not on file   Number of children: Not on file   Years of education: Not on file   Highest education level: Not on file  Occupational History   Not on file  Tobacco Use   Smoking status: Former    Years: 9.00    Types: Cigarettes    Quit date: 07/29/1984    Years since quitting: 36.9   Smokeless tobacco: Former    Types: Snuff    Quit date: 07/29/1988  Vaping Use   Vaping Use: Never used  Substance and Sexual Activity   Alcohol use: Not Currently   Drug use: No   Sexual activity: Not on file  Other Topics Concern   Not on file  Social History Narrative   Not on file   Social Determinants of Health   Financial Resource Strain: Not on file  Food Insecurity: Not on file  Transportation Needs: Not on file  Physical Activity: Not on file  Stress: Not on file  Social Connections: Not on file     Family History:  The patient's ***family history includes Alzheimer's disease in his father; Diabetes in his brother, father, and mother; Heart attack in his maternal grandmother, paternal grandfather, and paternal grandmother; Heart disease in his mother; Hyperlipidemia in his mother; Hypertension in his brother, father, and mother.   ROS General: Negative; No fevers, chills, or night sweats;  HEENT: Negative; No changes in vision or hearing, sinus congestion, difficulty swallowing Pulmonary: Negative; No cough, wheezing, shortness of breath, hemoptysis Cardiovascular: Negative; No chest pain, presyncope, syncope, palpitations GI: Negative; No nausea, vomiting, diarrhea, or abdominal pain GU: Negative; No dysuria, hematuria, or  difficulty voiding Musculoskeletal: Negative; no myalgias, joint pain, or weakness Hematologic/Oncology: Negative; no easy bruising, bleeding Endocrine: Negative; no heat/cold intolerance; no diabetes Neuro: Negative; no changes in balance, headaches Skin: Negative; No rashes or skin lesions Psychiatric: Negative; No behavioral problems, depression Sleep: Negative; No snoring, daytime sleepiness, hypersomnolence, bruxism, restless legs, hypnogognic hallucinations, no cataplexy Other comprehensive 14 point system review is negative.   PHYSICAL EXAM:   VS:  BP (!) 114/58    Pulse (!) 42    Ht 5' 5.5" (1.664 m)    Wt 195 lb 12.8 oz (88.8 kg)  SpO2 96%    BMI 32.09 kg/m    Wt Readings from Last 3 Encounters:  07/19/21 195 lb 12.8 oz (88.8 kg)  03/04/21 189 lb (85.7 kg)  02/17/21 183 lb (83 kg)    General: Alert, oriented, no distress.  Skin: normal turgor, no rashes, warm and dry HEENT: Normocephalic, atraumatic. Pupils equal round and reactive to light; sclera anicteric; extraocular muscles intact; Fundi ** Nose without nasal septal hypertrophy Mouth/Parynx benign; Mallinpatti scale Neck: No JVD, no carotid bruits; normal carotid upstroke Lungs: clear to ausculatation and percussion; no wheezing or rales Chest wall: without tenderness to palpitation Heart: PMI not displaced, RRR, s1 s2 normal, 1/6 systolic murmur, no diastolic murmur, no rubs, gallops, thrills, or heaves Abdomen: soft, nontender; no hepatosplenomehaly, BS+; abdominal aorta nontender and not dilated by palpation. Back: no CVA tenderness Pulses 2+ Musculoskeletal: full range of motion, normal strength, no joint deformities Extremities: no clubbing cyanosis or edema, Homan's sign negative  Neurologic: grossly nonfocal; Cranial nerves grossly wnl Psychologic: Normal mood and affect   Studies/Labs Reviewed:   ECG (independently read by me): Sinus bradycardia at 42; no ectopy, normal intervals  Recent Labs: BMP  Latest Ref Rng & Units 05/07/2019 04/18/2019 04/18/2019  Glucose 70 - 99 mg/dL 94 132(H) 135(H)  BUN 8 - 23 mg/dL 24(H) 21 21  Creatinine 0.61 - 1.24 mg/dL 1.00 1.37(H) 1.39(H)  Sodium 135 - 145 mmol/L 143 140 139  Potassium 3.5 - 5.1 mmol/L 3.8 4.1 4.0  Chloride 98 - 111 mmol/L 106 105 105  CO2 22 - 32 mmol/L - 23 24  Calcium 8.9 - 10.3 mg/dL - 9.2 9.1     Hepatic Function Latest Ref Rng & Units 04/18/2019 05/04/2014  Total Protein 6.5 - 8.1 g/dL 7.6 7.3  Albumin 3.5 - 5.0 g/dL 4.6 4.6  AST 15 - 41 U/L 34 36  ALT 0 - 44 U/L 49(H) 49  Alk Phosphatase 38 - 126 U/L 79 75  Total Bilirubin 0.3 - 1.2 mg/dL 0.7 0.8    CBC Latest Ref Rng & Units 05/07/2019 04/18/2019 04/18/2019  WBC 4.0 - 10.5 K/uL - 13.6(H) 14.2(H)  Hemoglobin 13.0 - 17.0 g/dL 15.0 15.3 15.7  Hematocrit 39.0 - 52.0 % 44.0 46.1 46.4  Platelets 150 - 400 K/uL - 237 231   Lab Results  Component Value Date   MCV 90.8 04/18/2019   MCV 90.9 04/18/2019   MCV 86.1 05/04/2014   Lab Results  Component Value Date   TSH 0.730 11/14/2016   No results found for: HGBA1C   BNP No results found for: BNP  ProBNP No results found for: PROBNP   Lipid Panel  No results found for: CHOL, TRIG, HDL, CHOLHDL, VLDL, LDLCALC, LDLDIRECT, LABVLDL   RADIOLOGY: No results found.   Additional studies/ records that were reviewed today include:  ***    ASSESSMENT:    No diagnosis found.   PLAN:  ***   Medication Adjustments/Labs and Tests Ordered: Current medicines are reviewed at length with the patient today.  Concerns regarding medicines are outlined above.  Medication changes, Labs and Tests ordered today are listed in the Patient Instructions below. There are no Patient Instructions on file for this visit.   Signed, Shelva Majestic, MD  07/19/2021 2:22 PM    [facility] [address], [place], [place], [place]  [address] Phone: [phone]

## 2021-07-25 ENCOUNTER — Encounter: Payer: Self-pay | Admitting: Cardiovascular Disease

## 2021-08-10 DIAGNOSIS — G4733 Obstructive sleep apnea (adult) (pediatric): Secondary | ICD-10-CM | POA: Diagnosis not present

## 2021-09-27 DIAGNOSIS — M722 Plantar fascial fibromatosis: Secondary | ICD-10-CM | POA: Diagnosis not present

## 2021-12-13 DIAGNOSIS — M722 Plantar fascial fibromatosis: Secondary | ICD-10-CM | POA: Diagnosis not present

## 2021-12-28 DIAGNOSIS — I700 Atherosclerosis of aorta: Secondary | ICD-10-CM | POA: Diagnosis not present

## 2021-12-28 DIAGNOSIS — K219 Gastro-esophageal reflux disease without esophagitis: Secondary | ICD-10-CM | POA: Diagnosis not present

## 2021-12-28 DIAGNOSIS — Z0000 Encounter for general adult medical examination without abnormal findings: Secondary | ICD-10-CM | POA: Diagnosis not present

## 2021-12-28 DIAGNOSIS — Z23 Encounter for immunization: Secondary | ICD-10-CM | POA: Diagnosis not present

## 2021-12-28 DIAGNOSIS — Z125 Encounter for screening for malignant neoplasm of prostate: Secondary | ICD-10-CM | POA: Diagnosis not present

## 2021-12-28 DIAGNOSIS — M722 Plantar fascial fibromatosis: Secondary | ICD-10-CM | POA: Diagnosis not present

## 2021-12-28 DIAGNOSIS — E7800 Pure hypercholesterolemia, unspecified: Secondary | ICD-10-CM | POA: Diagnosis not present

## 2021-12-28 DIAGNOSIS — R5383 Other fatigue: Secondary | ICD-10-CM | POA: Diagnosis not present

## 2021-12-28 DIAGNOSIS — F900 Attention-deficit hyperactivity disorder, predominantly inattentive type: Secondary | ICD-10-CM | POA: Diagnosis not present

## 2021-12-30 DIAGNOSIS — M722 Plantar fascial fibromatosis: Secondary | ICD-10-CM | POA: Diagnosis not present

## 2021-12-31 DIAGNOSIS — S92354D Nondisplaced fracture of fifth metatarsal bone, right foot, subsequent encounter for fracture with routine healing: Secondary | ICD-10-CM | POA: Diagnosis not present

## 2022-02-25 DIAGNOSIS — J101 Influenza due to other identified influenza virus with other respiratory manifestations: Secondary | ICD-10-CM | POA: Diagnosis not present

## 2022-02-25 DIAGNOSIS — R059 Cough, unspecified: Secondary | ICD-10-CM | POA: Diagnosis not present

## 2022-02-25 DIAGNOSIS — R509 Fever, unspecified: Secondary | ICD-10-CM | POA: Diagnosis not present

## 2022-02-25 DIAGNOSIS — Z20822 Contact with and (suspected) exposure to covid-19: Secondary | ICD-10-CM | POA: Diagnosis not present

## 2022-03-10 DIAGNOSIS — R350 Frequency of micturition: Secondary | ICD-10-CM | POA: Diagnosis not present

## 2022-03-10 DIAGNOSIS — N200 Calculus of kidney: Secondary | ICD-10-CM | POA: Diagnosis not present

## 2022-03-10 DIAGNOSIS — R3915 Urgency of urination: Secondary | ICD-10-CM | POA: Diagnosis not present

## 2022-03-24 DIAGNOSIS — G4733 Obstructive sleep apnea (adult) (pediatric): Secondary | ICD-10-CM | POA: Diagnosis not present

## 2022-04-16 ENCOUNTER — Other Ambulatory Visit: Payer: Self-pay | Admitting: Internal Medicine

## 2022-04-29 DIAGNOSIS — I491 Atrial premature depolarization: Secondary | ICD-10-CM | POA: Insufficient documentation

## 2022-05-03 ENCOUNTER — Ambulatory Visit: Payer: BC Managed Care – PPO | Attending: Internal Medicine | Admitting: Internal Medicine

## 2022-05-03 ENCOUNTER — Encounter: Payer: Self-pay | Admitting: Internal Medicine

## 2022-05-03 VITALS — BP 116/72 | HR 55 | Ht 67.0 in | Wt 188.4 lb

## 2022-05-03 DIAGNOSIS — R001 Bradycardia, unspecified: Secondary | ICD-10-CM

## 2022-05-03 DIAGNOSIS — I491 Atrial premature depolarization: Secondary | ICD-10-CM | POA: Diagnosis not present

## 2022-05-03 MED ORDER — ASPIRIN 81 MG PO TBEC: 81.0000 mg | DELAYED_RELEASE_TABLET | Freq: Every day | ORAL | 3 refills | Status: DC

## 2022-05-03 MED ORDER — HYDROCHLOROTHIAZIDE 12.5 MG PO CAPS: 12.5000 mg | ORAL_CAPSULE | Freq: Two times a day (BID) | ORAL | 3 refills | Status: DC

## 2022-05-03 NOTE — Patient Instructions (Addendum)
Medication Instructions:  Please discontinue your Amlodipine and Bisoprolol.  Increase Hydrochlorothiazide to 12.5 mg  twice a day. Start Asprin 81 mg daily. Continue all other medications as listed.  *If you need a refill on your cardiac medications before your next appointment, please call your pharmacy*  Follow-Up: At [facility], you and your health needs are our priority.  As part of our continuing mission to provide you with exceptional heart care, we have created designated Provider Care Teams.  These Care Teams include your primary Cardiologist (physician) and Advanced Practice Providers (APPs -  Physician Assistants and Nurse Practitioners) who all work together to provide you with the care you need, when you need it.  We recommend signing up for the patient portal called "MyChart".  Sign up information is provided on this After Visit Summary.  MyChart is used to connect with patients for Virtual Visits (Telemedicine).  Patients are able to view lab/test results, encounter notes, upcoming appointments, etc.  Non-urgent messages can be sent to your provider as well.   To learn more about what you can do with MyChart, go to [email or web address].    Your next appointment:   1 year(s)  The format for your next appointment:   In Person  Provider:   Dr Sherryl Manges       Important Information About Sugar

## 2022-05-03 NOTE — Progress Notes (Signed)
Patient Care Team: Kathyrn Lass, MD as PCP - General (Family Medicine)   HPI  Christopher Gordon is a 66 y.o. male Seen in follow-up for episodic fatigue and dizziness in the context of but not clearly associated with abnormal monitoring demonstrated abrupt changes in heart rate  Now with treated sleep apnea and Hypertension .    The patient denies chest pain, shortness of breath, nocturnal dyspnea, orthopnea or peripheral edema.  There have been no palpitations, lightheadedness or syncope.  Complains of resolved edema following amlodipine discontinuation and sluggishness with the discontinuation of his beta-blocker    Review of his monitoring (again) demonstrates sinus bradycardia interrupted by frequent runs of nonsustained supraventricular tachycardia or atrial ectopy with documented sinus rates ranging from 50-125 And changes in rate that are abrupt but not at multiples of the PP interval (that might suggest sinus exit block)    Date Cr K Hgb  12/20 1.0 3.8 15   8/23 1.06 4.3 14.2   DATE TEST EF   8/18 CTA    % CaScore 16               Records and Results Reviewed   Past Medical History:  Diagnosis Date   ADD (attention deficit disorder)    Allergic rhinitis    Arthritis    C. difficile colitis    Carpal tunnel syndrome    Diverticulitis    ED (erectile dysfunction)    GERD (gastroesophageal reflux disease)    Hemorrhoids    History of kidney stones    Hyperlipidemia    Hypertension    Irregular heart beat    Kidney lesion    Left side   Kidney stone    Left ureteral calculus    Low testosterone    Nephrolithiasis    right side calculi   Wears glasses    Wears partial dentures    UPPER AND LOWER    Past Surgical History:  Procedure Laterality Date   COLONOSCOPY     CYSTOSCOPY W/ URETERAL STENT PLACEMENT Right 04/24/2019   Procedure: CYSTOSCOPY WITH RETROGRADE PYELOGRAM/URETERAL STENT PLACEMENT;  Surgeon: Franchot Gallo, MD;  Location:  [facility];  Service: Urology;  Laterality: Right;   CYSTOSCOPY WITH STENT PLACEMENT Left 07/31/2014   Procedure: CYSTOSCOPY WITH STENT PLACEMENT;  Surgeon: Irine Seal, MD;  Location: [facility];  Service: Urology;  Laterality: Left;   CYSTOSCOPY/RETROGRADE/URETEROSCOPY/STONE EXTRACTION WITH BASKET Right 05/07/2019   Procedure: CYSTOSCOPY/URETEROSCOPY/BASKET EXTRACTION/STENT EXCHANGE;  Surgeon: Irine Seal, MD;  Location: [facility];  Service: Urology;  Laterality: Right;   EXTRACORPOREAL SHOCK WAVE LITHOTRIPSY Right 04/22/2019   Procedure: EXTRACORPOREAL SHOCK WAVE LITHOTRIPSY (ESWL);  Surgeon: Lucas Mallow, MD;  Location: [facility];  Service: Urology;  Laterality: Right;   KNEE SURGERY Right 04/2018   ORCHIOPEXY  as child   undescended testis   SHOULDER OPEN ROTATOR CUFF REPAIR Left 2011    Current Meds  Medication Sig   atorvastatin (LIPITOR) 40 MG tablet TAKE 1 TABLET DAILY (PLEASE CALL OFFICE AND SCHEDULE OVERDUE YEARLY APPOINTMENT FOR FURTHER REFILLS. SECOND ATTEMPT)   B Complex-Folic Acid (B COMPLEX PLUS PO) Take 1 tablet by mouth daily.   Cholecalciferol (VITAMIN D3) 1.25 MG (50000 UT) TABS Take 1 tablet by mouth daily.   Coenzyme Q10 (CO Q-10) 100 MG CAPS Take 4 tablets by mouth daily.   hydrochlorothiazide (HYDRODIURIL) 12.5 MG tablet Take 12.5 mg by mouth  every morning.   losartan (COZAAR) 100 MG tablet Take 100 mg by mouth daily.   meloxicam (MOBIC) 15 MG tablet Take 15 mg by mouth as needed.    methylphenidate (RITALIN) 10 MG tablet Take 10 mg by mouth 2 (two) times daily with breakfast and lunch.    pantoprazole (PROTONIX) 40 MG tablet Take 40 mg by mouth daily.   Potassium Citrate 15 MEQ (1620 MG) TBCR Take 1 tablet by mouth in the morning and at bedtime.    Allergies  Allergen Reactions   Crestor [Rosuvastatin] Other (See Comments)    "body aches"   Demerol [Meperidine] Nausea And Vomiting       Review of Systems negative except from HPI and PMH  Physical Exam BP 116/72   Pulse (!) 55   Ht 5\' 7"  (1.702 m)   Wt 188 lb 6.4 oz (85.5 kg)   SpO2 93%   BMI 29.51 kg/m    Well developed and nourished in no acute distress HENT normal Neck supple with JVP-  flat  Clear Regular rate and rhythm, no murmurs or gallops Abd-soft with active BS No Clubbing cyanosis edema Skin-warm and dry A & Oriented  Grossly normal sensory and motor function  ECG sinus @ 55 15/10/39   CrCl cannot be calculated (Patient's most recent lab result is older than the maximum 21 days allowed.).   Assessment and  Plan Fatigue/dizziness abrupt onset   Sleep disordered breathing  Sinus rates variable /abrupt HR changes  High blood pressure   Atrial ectopy and couplets   Coronary atherosclerosis  Palpitations are quiescient.  This despite having discontinued the bisoprolol which he did because of secondary bradycardia  Blood pressure is also reasonably controlled although appears to be quite variable.  He discontinued amlodipine because of edema not withstanding the down titrated dose.  We will increase his hydrochlorothiazide from 12.5--25 as needed as he has blood pressures at home in the 150 range.  Continues with some sinus rate changes, not particularly problematic.  With his calcium score 2018, HDL is low, LDL is not quite at goal at 80.  But for right now we will only respond with adding back low-dose aspirin

## 2022-05-23 ENCOUNTER — Other Ambulatory Visit: Payer: Self-pay | Admitting: Internal Medicine

## 2022-06-24 DIAGNOSIS — F900 Attention-deficit hyperactivity disorder, predominantly inattentive type: Secondary | ICD-10-CM | POA: Diagnosis not present

## 2022-06-24 DIAGNOSIS — Z23 Encounter for immunization: Secondary | ICD-10-CM | POA: Diagnosis not present

## 2022-06-24 DIAGNOSIS — K219 Gastro-esophageal reflux disease without esophagitis: Secondary | ICD-10-CM | POA: Diagnosis not present

## 2022-06-24 DIAGNOSIS — I10 Essential (primary) hypertension: Secondary | ICD-10-CM | POA: Diagnosis not present

## 2022-06-24 DIAGNOSIS — E7800 Pure hypercholesterolemia, unspecified: Secondary | ICD-10-CM | POA: Diagnosis not present

## 2022-07-01 DIAGNOSIS — U071 COVID-19: Secondary | ICD-10-CM | POA: Diagnosis not present

## 2022-07-18 DIAGNOSIS — R051 Acute cough: Secondary | ICD-10-CM | POA: Diagnosis not present

## 2022-07-18 DIAGNOSIS — Z03818 Encounter for observation for suspected exposure to other biological agents ruled out: Secondary | ICD-10-CM | POA: Diagnosis not present

## 2022-07-18 DIAGNOSIS — R509 Fever, unspecified: Secondary | ICD-10-CM | POA: Diagnosis not present

## 2022-08-04 ENCOUNTER — Ambulatory Visit: Payer: BC Managed Care – PPO | Admitting: Cardiovascular Disease

## 2022-08-13 IMAGING — MR MR ABDOMEN WO/W CM
11 of 16 series · 29 of 48 positions shown · IV contrast (multihance)
Comparison: CT on 05/01/2019 from [HOSPITAL]

CLINICAL DATA: Follow-up indeterminate left renal and liver
lesions.

EXAM:
MRI ABDOMEN WITHOUT AND WITH CONTRAST
TECHNIQUE: Multiplanar multisequence MR imaging of the abdomen was performed
both before and after the administration of intravenous contrast.
CONTRAST:  17mL MULTIHANCE GADOBENATE DIMEGLUMINE 529 MG/ML IV SOLN

[Series 3: cor haste · coronal · 5.0mm · 0.78mm/px · 1 of 40 slices shown]
[im 1/40]
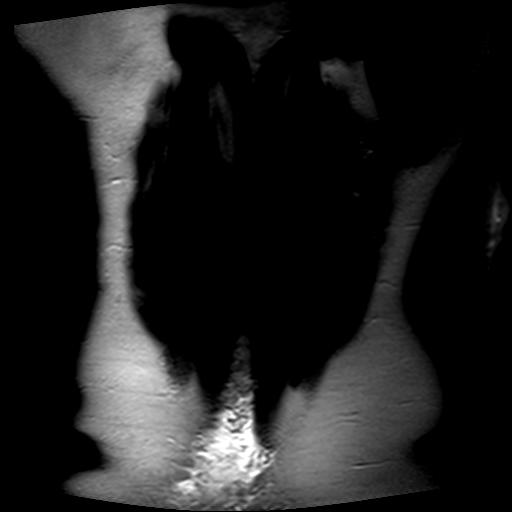

[Series 4: axial haste · axial · 6.0mm · 0.78mm/px · 1 of 42 slices shown]
[im 1/42]
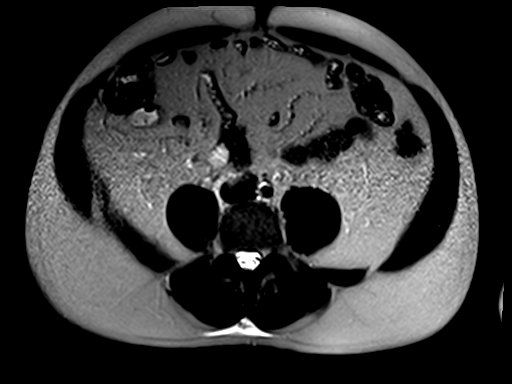

[Series 5: T1 · axial · 6.0mm · 0.78mm/px · z∈[-143,+127]mm · 3 of 84 slices shown]
[im 1/84]
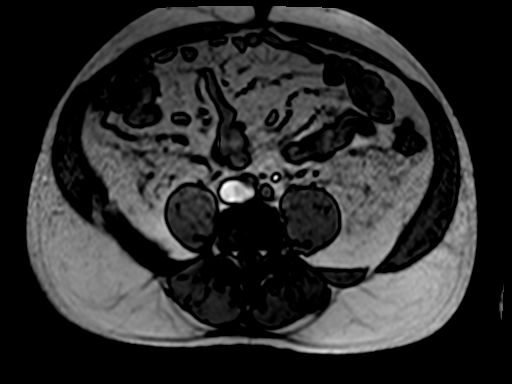
[im 42/84]
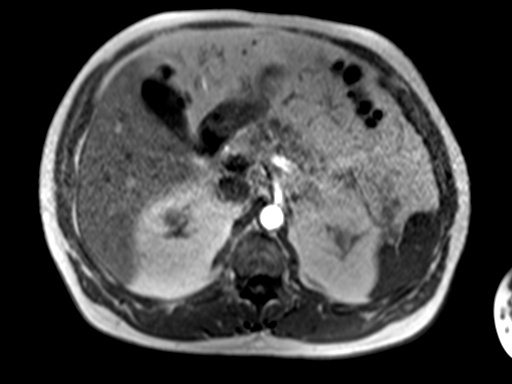
[im 84/84]
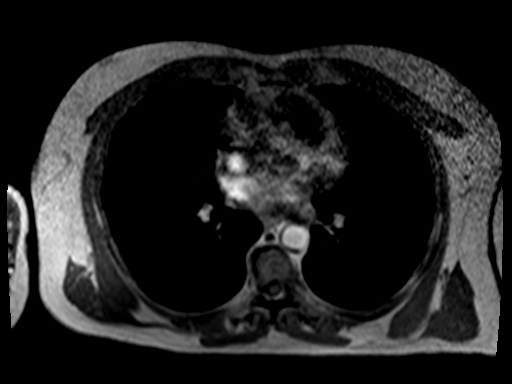

[Series 6: bSSFP · axial · 4.0mm · 0.78mm/px · z∈[-138,+122]mm · 2 of 66 slices shown]
[im 1/66]
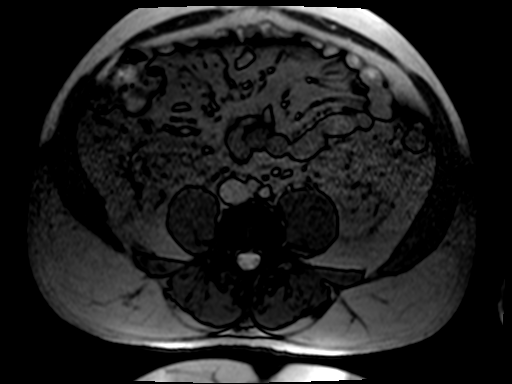
[im 66/66]
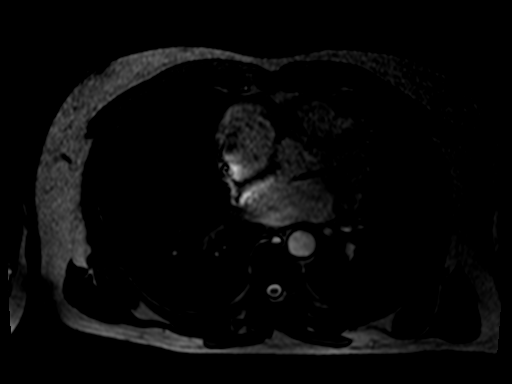

[Series 7: T2 fat-sat · axial · 6.0mm · 1.25mm/px · 1 of 40 slices shown]
[im 1/40]
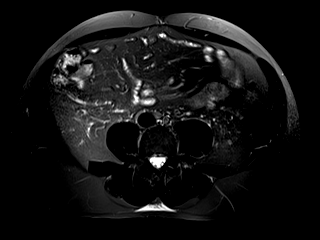

[Series 8: ep2d_diff_b50_500_800_p2_trig · axial · 6.0mm · 2.08mm/px · z∈[-122,+159]mm · 4 of 120 slices shown]
[im 1/120]
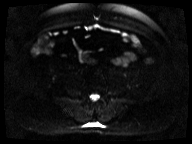
[im 40/120]
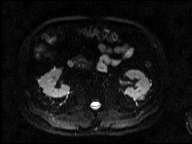
[im 80/120]
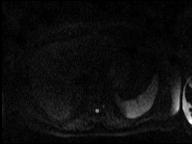
[im 120/120]
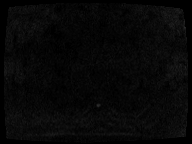

[Series 9: ep2d_diff_b50_500_800_p2_trig_adc · axial · 6.0mm · 2.08mm/px · 1 of 40 slices shown]
[im 1/40]
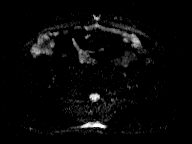

[Series 10: T1 dynamic · axial · non-contrast · 2.5mm · 0.78mm/px · z∈[-131,+126]mm · 4 of 104 slices shown]
[im 1/104]
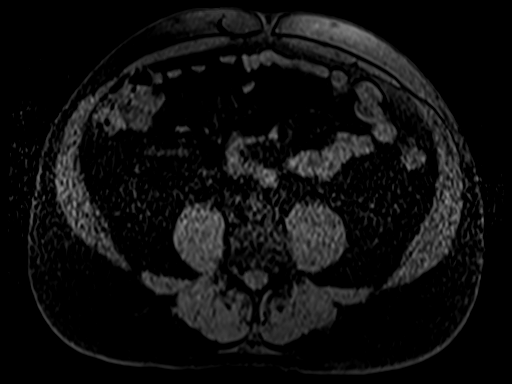
[im 35/104]
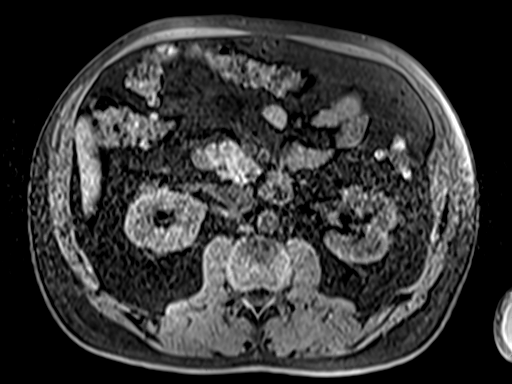
[im 69/104]
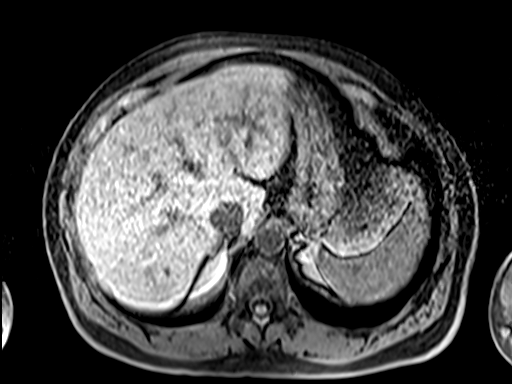
[im 104/104]
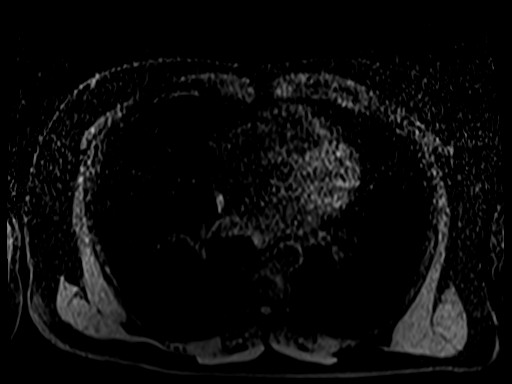

[Series 11: T1 dynamic post-contrast · axial · 2.5mm · 0.78mm/px · z∈[-131,+126]mm · 4 of 104 slices shown (1 of 3)]
[im 1/104]
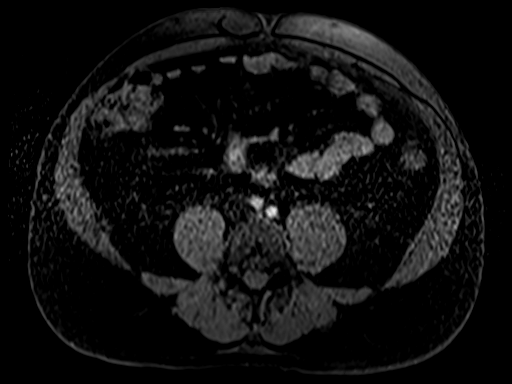
[im 35/104]
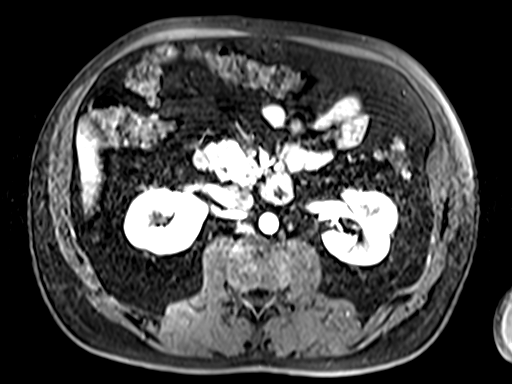
[im 69/104]
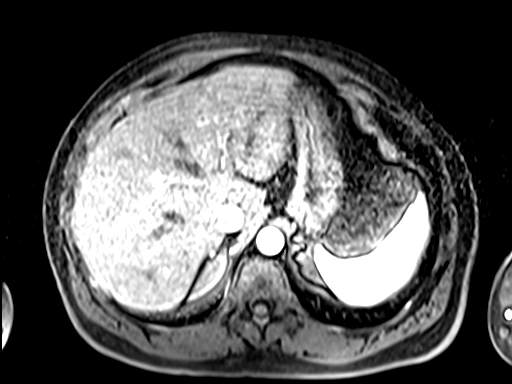
[im 104/104]
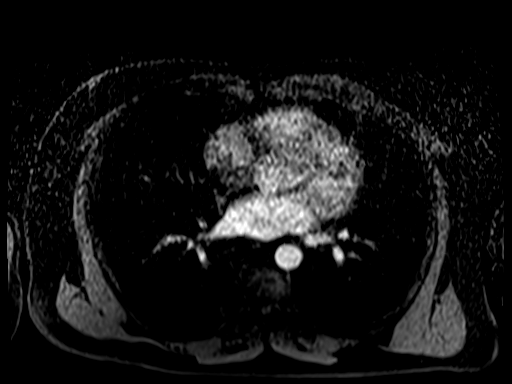

[Series 12: T1 dynamic post-contrast · axial · 2.5mm · 0.78mm/px · z∈[-131,+126]mm · 4 of 104 slices shown (2 of 3)]
[im 1/104]
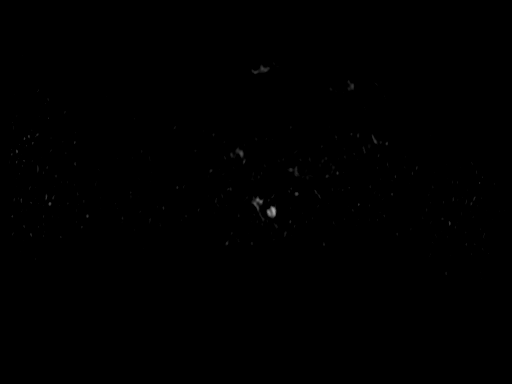
[im 35/104]
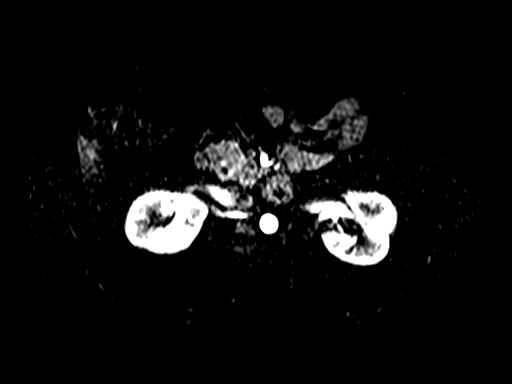
[im 69/104]
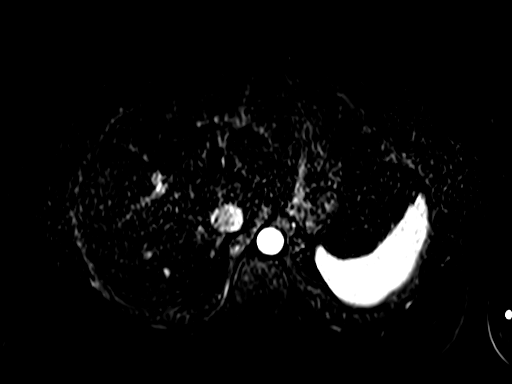
[im 104/104]
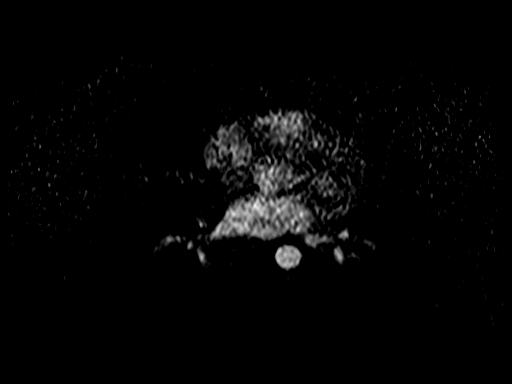

[Series 13: T1 dynamic post-contrast · axial · 2.5mm · 0.78mm/px · z∈[-131,+126]mm · 4 of 104 slices shown (3 of 3)]
[im 1/104]
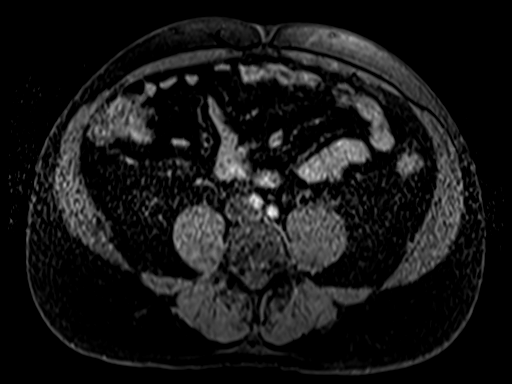
[im 35/104]
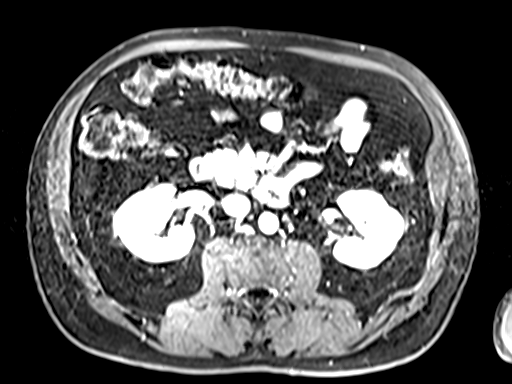
[im 69/104]
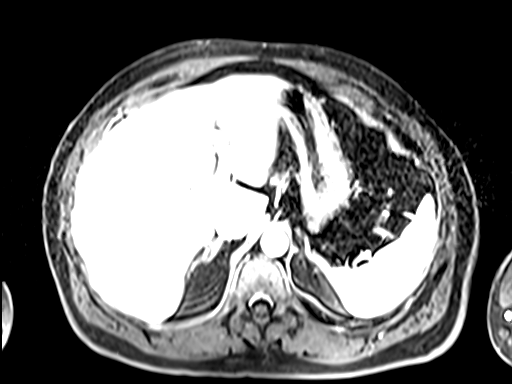
[im 104/104]
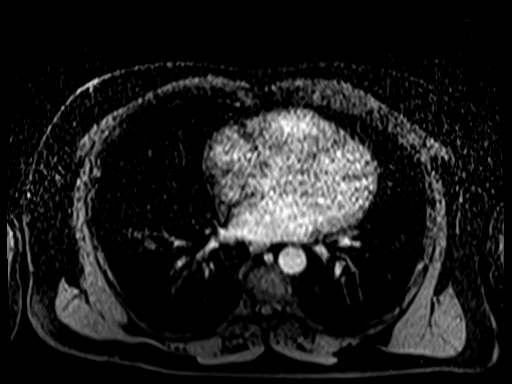

[29 of 48 positions shown; findings below may reference images not displayed]

FINDINGS: Lower chest: No acute findings.

Hepatobiliary: A 6 mm lesion in the inferior right hepatic lobe
remains stable in size. This shows marked T2 hyperintensity and
flash-filling which matches blood pool on all post-contrast
sequences, consistent with a tiny benign hemangioma. No other liver
lesions are identified. Gallbladder is unremarkable. No evidence of
biliary ductal dilatation.

Pancreas:  No mass or inflammatory changes.

Spleen:  Within normal limits in size and appearance.

Adrenals/Urinary Tract: A simple Bosniak category 1 subcapsular cyst
is seen in the anterior upper pole left kidney measuring 3.6 cm. No
renal masses are identified. No evidence of hydronephrosis.

Stomach/Bowel: Diverticulosis is seen involving the descending
colon. Otherwise unremarkable.

Vascular/Lymphatic: No pathologically enlarged lymph nodes
identified. No acute vascular findings.

Other:  None.

Musculoskeletal:  No suspicious bone lesions identified.
IMPRESSION: Stable tiny benign hemangioma in the right hepatic lobe.

Stable benign Bosniak category 1 left renal cyst.

No evidence of malignancy or other acute findings.

Colonic diverticulosis.

## 2022-09-21 ENCOUNTER — Ambulatory Visit: Payer: BC Managed Care – PPO | Admitting: Cardiovascular Disease

## 2022-11-25 ENCOUNTER — Other Ambulatory Visit: Payer: Self-pay

## 2022-11-25 MED ORDER — HYDROCHLOROTHIAZIDE 12.5 MG PO CAPS: 12.5000 mg | ORAL_CAPSULE | Freq: Two times a day (BID) | ORAL | 1 refills | Status: DC

## 2023-02-27 ENCOUNTER — Encounter: Payer: Self-pay | Admitting: Cardiology

## 2023-02-27 ENCOUNTER — Ambulatory Visit: Payer: BC Managed Care – PPO | Attending: Cardiology | Admitting: Cardiology

## 2023-02-27 VITALS — BP 112/70 | HR 93 | Resp 16 | Ht 67.0 in | Wt 192.4 lb

## 2023-02-27 DIAGNOSIS — R072 Precordial pain: Secondary | ICD-10-CM | POA: Diagnosis not present

## 2023-02-27 DIAGNOSIS — R0609 Other forms of dyspnea: Secondary | ICD-10-CM

## 2023-02-27 DIAGNOSIS — R931 Abnormal findings on diagnostic imaging of heart and coronary circulation: Secondary | ICD-10-CM | POA: Diagnosis not present

## 2023-02-27 DIAGNOSIS — I10 Essential (primary) hypertension: Secondary | ICD-10-CM | POA: Diagnosis not present

## 2023-02-27 MED ORDER — NITROGLYCERIN 0.4 MG SL SUBL: 0.4000 mg | SUBLINGUAL_TABLET | SUBLINGUAL | 4 refills | Status: AC | PRN

## 2023-02-27 MED ORDER — METOPROLOL SUCCINATE ER 25 MG PO TB24: 25.0000 mg | ORAL_TABLET | Freq: Every day | ORAL | 2 refills | Status: DC

## 2023-02-27 NOTE — Progress Notes (Signed)
Cardiology Office Note:  .   Date:  02/27/2023  ID:  Christopher Gordon, DOB [birth year], MRN [id number] PCP: Sigmund Hazel, MD  [facility] Providers Cardiologist:  Truett Mainland, MD PCP: Sigmund Hazel, MD  Chief Complaint  Patient presents with   Chest Pain      History of Present Illness: .    Christopher Gordon is a 67 y.o. 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  He displayed a sedentary lifestyle.  Recently, he has noticed frequent episodes of left-sided squeezing chest pain that sometimes wake him up from sleep.  Episodes last usually no more than 30 seconds.  He has not noticed any worsening of chest pain with exertion such as pushing lawnmower etc.  In addition, he is notices blood pressure has been elevated lately, although is well-controlled today.  Reviewed recent blood count results with the patient, details below.    Vitals:   02/27/23 0853  BP: 112/70  Pulse: 93  Resp: 16  SpO2: 96%     ROS:  Review of Systems  Cardiovascular:  Positive for chest pain and dyspnea on exertion. Negative for leg swelling, palpitations and syncope.     Studies Reviewed: Marland Kitchen       Labs 12/30/2022; Chol 151, TG 183, HDL 31, LDL 89  EKG 02/27/2023: Sinus rhythm 52 bpm No previous ECGs available  CT cardiac scoring 2018: 1. Coronary calcium score of 16. This was 76 percentile for age and sex matched control.  2. Normal coronary origin with right dominance. 3. Minimal non-obstructive plaque in the proximal LAD and RCA.      Physical Exam:   Physical Exam Vitals and nursing note reviewed.  Constitutional:      General: He is not in acute distress. Neck:     Vascular: No JVD.  Cardiovascular:     Rate and Rhythm: Normal rate and regular rhythm.     Heart sounds: Normal heart sounds. No murmur heard. Pulmonary:     Effort: Pulmonary effort is normal.     Breath sounds:  Normal breath sounds. No wheezing or rales.      VISIT DIAGNOSES:   ICD-10-CM   1. Precordial pain  R07.2 EKG 12-Lead       ASSESSMENT AND PLAN: .    Christopher Gordon is a 67 y.o. male with hypertension, hyperlipidemia, elevated coronary calcium score, OSA  Precordial pain: Squeezing nature concerning for angina.  However, short duration and nonexertional in nature was against angina.  Regardless, given his known elevated coronary calcium score noted on CT cardiac score exam in 2018, I would recommend coronary CT angiogram for definitive coronary anatomy evaluation. In the meantime, I have started him on metoprolol succinate 25 mg daily.  For both relief of angina as well as blood pressure improvement.  I asked him to take 2 pills of metoprolol succinate 25 mg 2 hours before the CT scan, and continue metoprolol succinate 25 mg daily otherwise. Also added sublingual nitroglycerin for as needed use. Continue aspirin, Lipitor 40 mg daily. Discussed diet and lifestyle modification, particularly reducing red meat intake.  I will repeat lipid panel in 3 months.  If LDL remains greater than 70, could consider increasing Lipitor to 80 mg daily. With occasional exertional dyspnea, will also obtain echocardiogram.  Hypertension: Well-controlled today, but has been elevated recently. Hopefully metoprolol should help.   Mixed hyperlipidemia: As above.  Meds ordered this  encounter  Medications   metoprolol succinate (TOPROL XL) 25 MG 24 hr tablet    Sig: Take 1 tablet (25 mg total) by mouth daily.    Dispense:  90 tablet    Refill:  2   nitroGLYCERIN (NITROSTAT) 0.4 MG SL tablet    Sig: Place 1 tablet (0.4 mg total) under the tongue every 5 (five) minutes as needed for chest pain.    Dispense:  25 tablet    Refill:  4     F/u in 3 months  Signed, Elder Negus, MD

## 2023-02-27 NOTE — Patient Instructions (Signed)
Medication Instructions:   NITROGLYCERIN 0.4 MG TAKE ONE TABLET UNDER THE TONGUE EVERY 5 MINS AS NEEDED FOR CHEST PAIN--IF YOU ARE GETTING TO TABLET #3 WITH NO RELIEF OF YOUR CHEST PAIN, PLEASE CALL 911  START TAKING METOPROLOL SUCCINATE (TOPROL XL) 25 MG BY MOUTH DAILY  *If you need a refill on your cardiac medications before your next appointment, please call your pharmacy*   Lab Work:  1.)  TODAY--BMET  2.)  IN 3 MONTHS HERE IN THE OFFICE--LIPIDS--PLEASE COME FASTING TO THIS LAB APPOINTMENT  If you have labs (blood work) drawn today and your tests are completely normal, you will receive your results only by: MyChart Message (if you have MyChart) OR A paper copy in the mail If you have any lab test that is abnormal or we need to change your treatment, we will call you to review the results.    Testing/Procedures:  Your physician has requested that you have an echocardiogram. Echocardiography is a painless test that uses sound waves to create images of your heart. It provides your doctor with information about the size and shape of your heart and how well your heart's chambers and valves are working. This procedure takes approximately one hour. There are no restrictions for this procedure. Please do NOT wear cologne, perfume, aftershave, or lotions (deodorant is allowed). Please arrive 15 minutes prior to your appointment time.        Your cardiac CT will be scheduled at one of the below locations:   [facility] [address] [place], [place] [address] [phone]   If scheduled at [facility], please arrive at the [facility] and Children's Entrance (Entrance C2) of [facility] 30 minutes prior to test start time. You can use the FREE valet parking offered at entrance C (encouraged to control the heart rate for the test)  Proceed to the [facility] Radiology Department (first floor) to check-in and test prep.  All radiology patients and  guests should use entrance C2 at [facility], accessed from [facility], even though the hospital's physical address listed is [address]      Please follow these instructions carefully (unless otherwise directed):  An IV will be required for this test and Nitroglycerin will be given.  Hold all erectile dysfunction medications at least 3 days (72 hrs) prior to test. (Ie viagra, cialis, sildenafil, tadalafil, etc)   On the Night Before the Test: Be sure to Drink plenty of water. Do not consume any caffeinated/decaffeinated beverages or chocolate 12 hours prior to your test. Do not take any antihistamines 12 hours prior to your test.   On the Day of the Test: Drink plenty of water until 1 hour prior to the test. Do not eat any food 1 hour prior to test. You may take your regular medications prior to the test.  Take metoprolol SUCCINATE (TOPROL XL) 50 MG BY MOUTH two hours prior to test. If you take Hydrochlorothiazide please HOLD on the morning of the test.       After the Test: Drink plenty of water. After receiving IV contrast, you may experience a mild flushed feeling. This is normal. On occasion, you may experience a mild rash up to 24 hours after the test. This is not dangerous. If this occurs, you can take Benadryl 25 mg and increase your fluid intake. If you experience trouble breathing, this can be serious. If it is severe call 911 IMMEDIATELY. If it is mild,  please call our office.   We will call to schedule your test 2-4 weeks out understanding that some insurance companies will need an authorization prior to the service being performed.   For more information and frequently asked questions, please visit our website : [email or web address]  For non-scheduling related questions, please contact the cardiac imaging nurse navigator should you have any questions/concerns: Cardiac Imaging Nurse Navigators Direct Office Dial:  [phone]   For scheduling needs, including cancellations and rescheduling, please call [place], [phone].    Follow-Up:  3 MONTHS WITH DR. PATWARDHAN OR AN EXTENDER

## 2023-02-28 LAB — BASIC METABOLIC PANEL
BUN/Creatinine Ratio: 22 (ref 10–24)
BUN: 22 mg/dL (ref 8–27)
CO2: 29 mmol/L (ref 20–29)
Calcium: 10.1 mg/dL (ref 8.6–10.2)
Chloride: 100 mmol/L (ref 96–106)
Creatinine, Ser: 1 mg/dL (ref 0.76–1.27)
Glucose: 96 mg/dL (ref 70–99)
Potassium: 4.3 mmol/L (ref 3.5–5.2)
Sodium: 143 mmol/L (ref 134–144)
eGFR: 84 mL/min/{1.73_m2} (ref >59)

## 2023-03-09 ENCOUNTER — Telehealth (HOSPITAL_COMMUNITY): Payer: Self-pay | Admitting: Emergency Medicine

## 2023-03-09 NOTE — Telephone Encounter (Signed)
Reaching out to patient to offer assistance regarding upcoming cardiac imaging study; pt verbalizes understanding of appt date/time, parking situation and where to check in, pre-test NPO status and medications ordered, and verified current allergies; name and call back number provided for further questions should they arise Cayne Yom RN Navigator Cardiac Imaging [facility] [phone] office [phone] cell 

## 2023-03-13 ENCOUNTER — Ambulatory Visit (HOSPITAL_COMMUNITY)
Admission: RE | Admit: 2023-03-13 | Discharge: 2023-03-13 | Disposition: A | Discharge status: Home / Self Care | Payer: BC Managed Care – PPO | Source: Ambulatory Visit | Attending: Cardiology | Admitting: Cardiology

## 2023-03-13 DIAGNOSIS — R931 Abnormal findings on diagnostic imaging of heart and coronary circulation: Secondary | ICD-10-CM | POA: Insufficient documentation

## 2023-03-13 DIAGNOSIS — R072 Precordial pain: Secondary | ICD-10-CM | POA: Insufficient documentation

## 2023-03-13 MED ORDER — NITROGLYCERIN 0.4 MG SL SUBL
0.8000 mg | SUBLINGUAL_TABLET | SUBLINGUAL | Status: DC | PRN
  Administered 2023-03-13: 0.8 mg via SUBLINGUAL

## 2023-03-13 MED ORDER — NITROGLYCERIN 0.4 MG SL SUBL
SUBLINGUAL_TABLET | SUBLINGUAL | Status: AC
Start: 2023-03-13 — End: ?
  Filled 2023-03-13: qty 2

## 2023-03-13 MED ORDER — IOHEXOL 350 MG/ML SOLN
100.0000 mL | Freq: Once | INTRAVENOUS | Status: AC | PRN
  Administered 2023-03-13: 100 mL via INTRAVENOUS

## 2023-03-22 ENCOUNTER — Ambulatory Visit (HOSPITAL_COMMUNITY): Payer: BC Managed Care – PPO | Attending: Cardiology

## 2023-03-22 DIAGNOSIS — R072 Precordial pain: Secondary | ICD-10-CM | POA: Diagnosis present

## 2023-03-22 LAB — ECHOCARDIOGRAM COMPLETE
Area-P 1/2: 3.99 cm2
S' Lateral: 3.1 cm

## 2023-03-23 NOTE — Progress Notes (Signed)
CT scan: Minimal nonobstructive coronary artery disease. Continue Lipitor. Reasonable to stop Aspirin.   Normal pumping function of the heart. No severe heart valve abnormalities noted. Right heart mildly enlarged, which is likely nonspecific finding. However, given lack of clear reason for chest pain, recommend checking d-Dimer for screening to rule put lung artery clot (CT can for coronaries is not geared specifically to look at lung clots).   Thanks MJP

## 2023-03-24 ENCOUNTER — Telehealth: Payer: Self-pay | Admitting: Cardiology

## 2023-03-24 DIAGNOSIS — R072 Precordial pain: Secondary | ICD-10-CM

## 2023-03-24 NOTE — Telephone Encounter (Signed)
Patient returned RN's call regarding results. 

## 2023-03-24 NOTE — Telephone Encounter (Signed)
-----   Message from [facility] sent at 03/24/2023  6:21 AM EST -----  ----- Message ----- From: Elder Negus, MD Sent: 03/23/2023   4:31 PM EST To: [facility]v [facility] Triage  CT scan: Minimal nonobstructive coronary artery disease. Continue Lipitor. Reasonable to stop Aspirin.   Normal pumping function of the heart. No severe heart valve abnormalities noted. Right heart mildly enlarged, which is likely nonspecific finding. However, given lack of clear reason for chest pain, recommend checking d-Dimer for screening to rule put lung artery clot (CT can for coronaries is not geared specifically to look at lung clots).   Thanks MJP

## 2023-03-24 NOTE — Telephone Encounter (Signed)
The patient has been notified of the result and verbalized understanding.  All questions (if any) were answered.  Pt states he will stop taking his ASA 81 mg po daily and continue all his other cardiac medications, including his lipitor.   Pt states he cannot come in for lab work to check the d-dimer, until next Monday, for he is away at this time.   Order for D-dimer placed in the system for the pt to come and get done on next Monday 11/18.  Order released in the system.  Pt verbalized understanding and agrees with this plan.

## 2023-03-28 LAB — D-DIMER, QUANTITATIVE: D-DIMER: <0.2 mg{FEU}/L (ref 0.00–0.49)

## 2023-05-12 ENCOUNTER — Encounter: Payer: Self-pay | Admitting: Internal Medicine

## 2023-05-12 ENCOUNTER — Ambulatory Visit: Payer: BC Managed Care – PPO | Attending: Internal Medicine | Admitting: Internal Medicine

## 2023-05-12 VITALS — BP 110/68 | HR 49 | Ht 67.0 in | Wt 194.2 lb

## 2023-05-12 DIAGNOSIS — I491 Atrial premature depolarization: Secondary | ICD-10-CM

## 2023-05-12 MED ORDER — ASPIRIN 81 MG PO TBEC: 81.0000 mg | DELAYED_RELEASE_TABLET | Freq: Every day | ORAL | 3 refills | Status: AC

## 2023-05-12 MED ORDER — LOSARTAN POTASSIUM-HCTZ 100-12.5 MG PO TABS: 1.0000 | ORAL_TABLET | Freq: Every day | ORAL | 1 refills | Status: DC

## 2023-05-12 NOTE — Progress Notes (Signed)
 Patient Care Team: Cleotilde Planas, MD as PCP - General (Family Medicine)   HPI  Christopher Gordon is a 68 y.o. male Seen in follow-up for episodic fatigue and dizziness in the context of but not clearly associated with abnormal  monitoring demonstrated which had documented abrupt changes in heart rate  Now with treated sleep apnea and Hypertension .   The patient denies chest pain, shortness of breath, nocturnal dyspnea, orthopnea or peripheral edema.  There have been no palpitations, lightheadedness or syncope.  But does little physical effort.  He is very busy at work.  Has a sense that he does not want to do things that may be related to his beta-blocker and the slower heart rates.  The beta-blocker had been effective in controlling his palpitations.        Date Cr K Hgb  12/20 1.0 3.8 15   8/23 1.06 4.3 14.2  10/24 1.0 4.3    DATE TEST EF   8/18 CTA    % CaScore 16  10/24 CaScore  CaScore 98  10/24 Echo  60-65%      Records and Results Reviewed   Past Medical History:  Diagnosis Date   ADD (attention deficit disorder)    Allergic rhinitis    Arthritis    C. difficile colitis    Carpal tunnel syndrome    Diverticulitis    ED (erectile dysfunction)    GERD (gastroesophageal reflux disease)    Hemorrhoids    History of kidney stones    Hyperlipidemia    Hypertension    Irregular heart beat    Kidney lesion    Left side   Kidney stone    Left ureteral calculus    Low testosterone    Nephrolithiasis    right side calculi   Wears glasses    Wears partial dentures    UPPER AND LOWER    Past Surgical History:  Procedure Laterality Date   COLONOSCOPY     CYSTOSCOPY W/ URETERAL STENT PLACEMENT Right 04/24/2019   Procedure: CYSTOSCOPY WITH RETROGRADE PYELOGRAM/URETERAL STENT PLACEMENT;  Surgeon: Matilda Senior, MD;  Location: [facility];  Service: Urology;  Laterality: Right;   CYSTOSCOPY WITH STENT PLACEMENT Left 07/31/2014    Procedure: CYSTOSCOPY WITH STENT PLACEMENT;  Surgeon: Norleen Seltzer, MD;  Location: [facility];  Service: Urology;  Laterality: Left;   CYSTOSCOPY/RETROGRADE/URETEROSCOPY/STONE EXTRACTION WITH BASKET Right 05/07/2019   Procedure: CYSTOSCOPY/URETEROSCOPY/BASKET EXTRACTION/STENT EXCHANGE;  Surgeon: Seltzer Norleen, MD;  Location: [facility];  Service: Urology;  Laterality: Right;   EXTRACORPOREAL SHOCK WAVE LITHOTRIPSY Right 04/22/2019   Procedure: EXTRACORPOREAL SHOCK WAVE LITHOTRIPSY (ESWL);  Surgeon: Carolee Sherwood JONETTA DOUGLAS, MD;  Location: [facility];  Service: Urology;  Laterality: Right;   KNEE SURGERY Right 04/2018   ORCHIOPEXY  as child   undescended testis   SHOULDER OPEN ROTATOR CUFF REPAIR Left 2011    Current Meds  Medication Sig   atorvastatin  (LIPITOR) 40 MG tablet TAKE 1 TABLET DAILY (PLEASE CALL OFFICE AND SCHEDULE OVERDUE YEARLY APPOINTMENT FOR FURTHER REFILLS. SECOND ATTEMPT)   B Complex-Folic Acid (B COMPLEX PLUS PO) Take 1 tablet by mouth daily.   Cholecalciferol (VITAMIN D3) 1.25 MG (50000 UT) TABS Take 1 tablet by mouth daily.   Coenzyme Q10 (CO Q-10) 100 MG CAPS Take 4 tablets by mouth daily.   hydrochlorothiazide  (MICROZIDE ) 12.5 MG capsule Take 1 capsule (12.5 mg total) by mouth 2 (two)  times daily.   losartan  (COZAAR ) 100 MG tablet Take 100 mg by mouth daily.   meloxicam  (MOBIC ) 15 MG tablet Take 15 mg by mouth as needed.    methylphenidate  (RITALIN ) 10 MG tablet Take 10 mg by mouth 2 (two) times daily with breakfast and lunch.    metoprolol  succinate (TOPROL  XL) 25 MG 24 hr tablet Take 1 tablet (25 mg total) by mouth daily.   nitroGLYCERIN  (NITROSTAT ) 0.4 MG SL tablet Place 1 tablet (0.4 mg total) under the tongue every 5 (five) minutes as needed for chest pain.   pantoprazole  (PROTONIX ) 40 MG tablet Take 40 mg by mouth daily.   Potassium Citrate  15 MEQ (1620 MG) TBCR Take 1 tablet by mouth in the morning and at bedtime.     Allergies  Allergen Reactions   Crestor [Rosuvastatin] Other (See Comments)    body aches   Demerol [Meperidine] Nausea And Vomiting   Amlodipine Other (See Comments)    edema      Review of Systems negative except from HPI and PMH  Physical Exam BP 110/68   Pulse (!) 49   Ht 5' 7 (1.702 m)   Wt 194 lb 3.2 oz (88.1 kg)   SpO2 96%   BMI 30.42 kg/m    Well developed and nourished in no acute distress HENT normal Neck supple with JVP-  flat  Clear Regular rate and rhythm, no murmurs or gallops Abd-soft with active BS No Clubbing cyanosis edema Skin-warm and dry A & Oriented  Grossly normal sensory and motor function  ECG sinus at 49 alternating with sinus at about 75.  P wave morphologies are almost identical suggesting variability in sinus node function  CrCl cannot be calculated (Patient's most recent lab result is older than the maximum 21 days allowed.).   Assessment and  Plan Fatigue/dizziness abrupt onset   Sleep disordered breathing  Sinus rates variable /abrupt HR changes  High blood pressure   Atrial ectopy and couplets   Coronary atherosclerosis  Palpitations remain quiescent.  He wonders whether the bradycardia which was made worse by the metoprolol  is associated with some of his lassitude.  In the absence of symptoms, we will discontinue it.  He remains on a statin and on aspirin  with his coronary atherosclerosis  Encouraged him and exercising. \ Also suggested that he consider the lassitude if he does not improve following discontinuation of beta-blocker there may not be a manifestation of Tinel's.

## 2023-05-12 NOTE — Patient Instructions (Signed)
 Medication Instructions:  Your physician has recommended you make the following change in your medication:   ** Stop Losartan   ** Stop hydrochlorothiazide   ** Stop Metoprolol   ** Begin Losartan -hydrochlorothiazide  100/12.5mg  - 1 tablet by mouth daily   *If you need a refill on your cardiac medications before your next appointment, please call your pharmacy*   Lab Work: None ordered.  If you have labs (blood work) drawn today and your tests are completely normal, you will receive your results only by: MyChart Message (if you have MyChart) OR A paper copy in the mail If you have any lab test that is abnormal or we need to change your treatment, we will call you to review the results.   Testing/Procedures: None ordered.    Follow-Up: At [facility], you and your health needs are our priority.  As part of our continuing mission to provide you with exceptional heart care, we have created designated Provider Care Teams.  These Care Teams include your primary Cardiologist (physician) and Advanced Practice Providers (APPs -  Physician Assistants and Nurse Practitioners) who all work together to provide you with the care you need, when you need it.  We recommend signing up for the patient portal called MyChart.  Sign up information is provided on this After Visit Summary.  MyChart is used to connect with patients for Virtual Visits (Telemedicine).  Patients are able to view lab/test results, encounter notes, upcoming appointments, etc.  Non-urgent messages can be sent to your provider as well.   To learn more about what you can do with MyChart, go to [email or web address].    Your next appointment:   12 months with Dr Fernande

## 2023-05-19 ENCOUNTER — Ambulatory Visit: Payer: BC Managed Care – PPO | Admitting: Cardiology

## 2023-06-23 ENCOUNTER — Other Ambulatory Visit: Payer: Self-pay | Admitting: Internal Medicine

## 2023-09-12 ENCOUNTER — Other Ambulatory Visit: Payer: Self-pay

## 2023-09-12 MED ORDER — LOSARTAN POTASSIUM-HCTZ 100-12.5 MG PO TABS: 1.0000 | ORAL_TABLET | Freq: Every day | ORAL | 2 refills | Status: DC

## 2023-11-02 ENCOUNTER — Other Ambulatory Visit: Payer: Self-pay | Admitting: Internal Medicine

## 2024-02-14 ENCOUNTER — Telehealth: Payer: Self-pay | Admitting: Cardiology

## 2024-02-14 NOTE — Telephone Encounter (Signed)
 Sure, we can order 2 week Zio. I don't see an appt scheduled with me. Please check.  Thanks MJP

## 2024-02-14 NOTE — Telephone Encounter (Signed)
 Call to patient as dtr who called in is not on DPR. Patient answered and identify verified. Patient reports that he last saw Dr. Fernande in January of 2025. He was taken off several blood pressure meds as well as toprol . He has a history of variable sinus rates with abrupt HR changes.  Patient reports he got a BP monitor to check his BP readings at home at the request of his PCP Dr. Olam Pinal. He reported to Dr. Pinal, when he turned in those BP readings, that his BP machine alerted afib  often. Dr. Pinal ordered a home heart monitor, which patient is wearing now. He states his last day of wear is Monday 02/19/24.   He also reports that he is currently on hyzaar (losartan  hydrochlorothiazide ) and recently his PCP added a small extra dose of 12.5 mg hydrochlorothiazide  to his regimen. He reports his BP is still elevated at 164/89. He also reports that his HR is labile which is a long standing issue for him. He also states that he has left upper chest pain occasionally and SOB with exertion. He also reports lightheadedness intermittently at rest and on exertion. It appears he discussed dizziness, fatigue with Dr. Fernande at last visit. Patient would like to be seen. Forwarded to Dr. Elmira, with whom patient is due for a visit in October 2025.

## 2024-02-14 NOTE — Telephone Encounter (Signed)
 Patient c/o Palpitations:  STAT if patient reporting lightheadedness, shortness of breath, or chest pain  How long have you had palpitations/irregular HR/ Afib? Are you having the symptoms now? Atleast a month   Are you currently experiencing lightheadedness, SOB or CP? Not that she knows of  Do you have a history of afib (atrial fibrillation) or irregular heart rhythm? Per daughter, not that she is aware of  Have you checked your BP or HR? (document readings if available): BP not registering because it cuff keeps stating Afib per daughter.   Are you experiencing any other symptoms? Patient does complain of lightheadedness and chest pain occasionally.    On heart monitor per PCP and heart monitor keeps alerting for afib. Patient is on the way home from a business trip today and daughter would like him seen ASAP.

## 2024-02-15 NOTE — Telephone Encounter (Signed)
 Pt is currently wearing a heart monitor and is supposed to turn it in on Monday. Pt reports that he will have the results sent over to our office. Appt was made for 03/15/2024.

## 2024-02-16 NOTE — Telephone Encounter (Signed)
 Noted.  Thanks MJP

## 2024-03-01 ENCOUNTER — Other Ambulatory Visit: Payer: Self-pay | Admitting: Student

## 2024-03-05 MED ORDER — LOSARTAN POTASSIUM-HCTZ 100-12.5 MG PO TABS: 1.0000 | ORAL_TABLET | Freq: Every day | ORAL | 0 refills | Status: DC

## 2024-03-06 ENCOUNTER — Other Ambulatory Visit: Payer: Self-pay | Admitting: Student

## 2024-03-15 ENCOUNTER — Encounter: Payer: Self-pay | Admitting: Cardiology

## 2024-03-15 ENCOUNTER — Ambulatory Visit: Attending: Cardiology | Admitting: Cardiology

## 2024-03-15 VITALS — BP 126/72 | HR 81 | Ht 66.0 in | Wt 191.8 lb

## 2024-03-15 DIAGNOSIS — R931 Abnormal findings on diagnostic imaging of heart and coronary circulation: Secondary | ICD-10-CM | POA: Diagnosis not present

## 2024-03-15 DIAGNOSIS — I10 Essential (primary) hypertension: Secondary | ICD-10-CM | POA: Diagnosis not present

## 2024-03-15 DIAGNOSIS — I4719 Other supraventricular tachycardia: Secondary | ICD-10-CM | POA: Diagnosis not present

## 2024-03-15 NOTE — Patient Instructions (Signed)
 Medication Instructions:   Your physician recommends that you continue on your current medications as directed. Please refer to the Current Medication list given to you today.  *If you need a refill on your cardiac medications before your next appointment, please call your pharmacy*    Follow-Up: At [facility], you and your health needs are our priority.  As part of our continuing mission to provide you with exceptional heart care, our providers are all part of one team.  This team includes your primary Cardiologist (physician) and Advanced Practice Providers or APPs (Physician Assistants and Nurse Practitioners) who all work together to provide you with the care you need, when you need it.  Your next appointment:   6 month(s)  Provider:   Newman JINNY Lawrence, MD

## 2024-03-15 NOTE — Progress Notes (Signed)
  Cardiology Office Note:  .   Date:  03/15/2024  ID:  FLINT HAKEEM, DOB 1955/10/23, MRN [id number] PCP: Cleotilde Planas, MD   HeartCare Providers Cardiologist:  Newman Lawrence, MD PCP: Cleotilde Planas, MD  Chief Complaint  Patient presents with   Palpitations      History of Present Illness: .    Christopher Gordon is a 68 y.o. male with hypertension, hyperlipidemia, elevated coronary calcium  score, OSA  Patient's blood pressure monitor recently notified of possible atrial fibrillation.  ZIO monitor was placed by PCP and did not show atrial fibrillation, showed occasional episodes of atrial tachycardia.  Patient has only occasional palpitation symptoms.  He denies any complaints of exertional chest pain.  He has occasional precordial pain lasting for few seconds, similar to what he has experienced in the past.   Vitals:   03/15/24 1053  BP: 126/72  Pulse: 81  SpO2: 96%      ROS:  Review of Systems  Cardiovascular:  Positive for chest pain and dyspnea on exertion. Negative for leg swelling, palpitations and syncope.     Studies Reviewed: SABRA       EKG 02/27/2023: Sinus rhythm 52 bpm No previous ECGs available  Zio patch monitor 13 days 02/05/2024 - 02/19/2024: Dominant rhythm: Sinus. HR 39-155 bpm. Avg HR 72 bpm, in sinus rhythm. 11 episodes of SVT/atrial tachycardia, fastest at 190 bpm for 4 beats, longest for 12 beats at 98 bpm, detected within 45 seconds of symptomatic patient events. <1% SVE, VE. No atrial fibrillation/atrial flutter/VT/high grade AV block, sinus pause >3sec noted.  CT cardiac scoring 2018: 1. Coronary calcium  score of 16. This was 37 percentile for age and sex matched control.  2. Normal coronary origin with right dominance. 3. Minimal non-obstructive plaque in the proximal LAD and RCA.   Labs 12/30/2022; Chol 151, TG 183, HDL 31, LDL 89   Physical Exam:   Physical Exam Vitals and nursing note reviewed.  Constitutional:      General:  He is not in acute distress. Neck:     Vascular: No JVD.  Cardiovascular:     Rate and Rhythm: Normal rate and regular rhythm.     Heart sounds: Normal heart sounds. No murmur heard. Pulmonary:     Effort: Pulmonary effort is normal.     Breath sounds: Normal breath sounds. No wheezing or rales.      VISIT DIAGNOSES:   ICD-10-CM   1. PAT (paroxysmal atrial tachycardia)  I47.19     2. Elevated coronary artery calcium  score  R93.1     3. Primary hypertension  I10         ASSESSMENT AND PLAN: .    LA DIBELLA is a 68 y.o. male with hypertension, hyperlipidemia, elevated coronary calcium  score, OSA  Palpitations: Recent episodes of brief palpitations likely atrial tachycardia. I have encouraged him to use smart watches or monitoring devices longitudinally at home to detect for atrial fibrillation. Given his occasional bradycardia, I would avoid starting metoprolol  at this time.  Elevated coronary calcium  score: Mild nonobstructive CAD on coronary CT angiogram in 03/2023. Tolerating aspirin  without any bleeding issues, will continue. Continue Lipitor 80 mg daily, lipids well-controlled.  Hypertension: Well-controlled on hydrochlorothiazide  12.5 mg daily.  No orders of the defined types were placed in this encounter.    F/u in 6 months  Signed, Newman JINNY Lawrence, MD

## 2024-05-03 ENCOUNTER — Telehealth: Payer: Self-pay | Admitting: Cardiology

## 2024-05-03 DIAGNOSIS — G4733 Obstructive sleep apnea (adult) (pediatric): Secondary | ICD-10-CM

## 2024-05-03 DIAGNOSIS — I10 Essential (primary) hypertension: Secondary | ICD-10-CM

## 2024-05-03 NOTE — Telephone Encounter (Signed)
**Note De-Identified Ardella Chhim Obfuscation** The pt states that he will lose his FSA (flexible spending account) money at the end of the year if he does not use it. He is requesting that his CPAP machine supplies be ordered before the end of this year.  He is aware that our sleep coordinator is out of the office this week but will be back on Monday 05/06/2024 and that I am forwarding this message to her and that she will call him back.  He verbalized understanding and thanked me for calling him back.

## 2024-05-03 NOTE — Telephone Encounter (Signed)
 Told his needs a new prescription for Cpap supplies. Please advise.

## 2024-05-03 NOTE — Telephone Encounter (Signed)
 Patient seen by Dr. Burnard in the past, states he was informed he needs a new prescription for his CPAP supplies (filter, mask, and tank).  He would really like to have this completed before the end of the year so he can use his FSA.  Informed patient I will forward to our sleep study team to follow-up with him about this matter.

## 2024-05-07 NOTE — Telephone Encounter (Signed)
 Supplies order sent to [facility]   Upon patient request DME selection is [facility] Patient understands he will be contacted by [facility] to set up his cpap. Patient understands to call if [facility] does not contact him with new setup in a timely manner. Patient understands they will be called once confirmation has been received from [facility] that they have received their new machine to schedule 10 week follow up appointment.   [facility] notified of new cpap order  Please add to airview Patient was grateful for the call and thanked me.

## 2024-06-05 ENCOUNTER — Other Ambulatory Visit: Payer: Self-pay | Admitting: Student

## 2024-06-10 NOTE — Telephone Encounter (Signed)
 In accordance with refill protocols, please review and address the following requirements before this medication refill can be authorized:  Labs
